# Patient Record
Sex: Female | Born: 1995 | Race: White | Hispanic: Yes | Marital: Single | Smoking: Never smoker
Health system: Southern US, Community
[De-identification: ages and names within clinical notes are randomized; demographics above are authoritative.]

## PROBLEM LIST (undated history)

## (undated) DIAGNOSIS — K5900 Constipation, unspecified: Secondary | ICD-10-CM

## (undated) DIAGNOSIS — F32A Depression, unspecified: Secondary | ICD-10-CM

## (undated) DIAGNOSIS — R51 Headache: Secondary | ICD-10-CM

## (undated) DIAGNOSIS — R519 Headache, unspecified: Secondary | ICD-10-CM

## (undated) DIAGNOSIS — R42 Dizziness and giddiness: Secondary | ICD-10-CM

## (undated) DIAGNOSIS — F419 Anxiety disorder, unspecified: Secondary | ICD-10-CM

## (undated) DIAGNOSIS — R630 Anorexia: Secondary | ICD-10-CM

## (undated) DIAGNOSIS — F319 Bipolar disorder, unspecified: Secondary | ICD-10-CM

## (undated) HISTORY — DX: Dizziness and giddiness: R42

## (undated) HISTORY — PX: TOOTH EXTRACTION: SUR596

## (undated) HISTORY — DX: Headache: R51

## (undated) HISTORY — DX: Constipation, unspecified: K59.00

## (undated) HISTORY — DX: Headache, unspecified: R51.9

---
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
## 2017-01-22 ENCOUNTER — Encounter: Payer: Self-pay | Admitting: Family Medicine

## 2017-01-22 ENCOUNTER — Ambulatory Visit (INDEPENDENT_AMBULATORY_CARE_PROVIDER_SITE_OTHER): Payer: 59 | Admitting: Family Medicine

## 2017-01-22 VITALS — BP 102/70 | HR 57 | Ht 65.0 in | Wt 147.0 lb

## 2017-01-22 DIAGNOSIS — R197 Diarrhea, unspecified: Secondary | ICD-10-CM | POA: Diagnosis not present

## 2017-01-22 DIAGNOSIS — F509 Eating disorder, unspecified: Secondary | ICD-10-CM | POA: Diagnosis not present

## 2017-01-22 DIAGNOSIS — R110 Nausea: Secondary | ICD-10-CM | POA: Diagnosis not present

## 2017-01-22 NOTE — Progress Notes (Signed)
Subjective:   Patient ID: Laura Mora, female    DOB: 1995-10-21, 21 y.o.   MRN: [id number]  Laura Mora is a pleasant 21 y.o. year old female who presents to clinic today with Nausea  on 01/22/2017  HPI:  H/o intermittent nausea and vomiting- cyclical for years- in past, seemed related to her eating disorder (she was in an inpatient treatment facility for this earlier this year).  Now she is also having diarrhea x 4 weeks.  No blood in stool.  No fever.  Some epigastric and RLL pain.  Of note, brother was recently treated for "a parasite."  Their father is from [place] and she has been to [place] although not recently.  Phenergan does help with nausea but makes her sleepy.  Zofran has not been effective.  She feels her eating disorder has been "under okay control" and not what is causing her symptoms but admits that being nauseated all   Current Outpatient Prescriptions on File Prior to Visit  Medication Sig Dispense Refill  . cyclobenzaprine (FLEXERIL) 10 MG tablet Take 10 mg by mouth 3 (three) times daily as needed for muscle spasms.    Marland Kitchen. docusate sodium (COLACE) 100 MG capsule Take 100 mg by mouth 2 (two) times daily.    . feeding supplement, ENSURE ENLIVE, (ENSURE ENLIVE) LIQD Take 237 mLs by mouth daily as needed.    . Flaxseed, Linseed, (FLAX SEEDS PO) Take by mouth.    . Melatonin-Pyridoxine (MELATONEX PO) Take by mouth.    . meloxicam (MOBIC) 15 MG tablet Take 15 mg by mouth daily.    . Multiple Vitamin (MULTIVITAMIN) tablet Take 1 tablet by mouth daily.    . Omeprazole 20 MG TBEC Take by mouth.    . ondansetron (ZOFRAN) 4 MG tablet Take 4 mg by mouth every 8 (eight) hours as needed for nausea or vomiting.    . polyethylene glycol (MIRALAX / GLYCOLAX) packet Take 17 g by mouth daily.    . promethazine (PHENERGAN) 12.5 MG tablet Take 12.5 mg by mouth every 6 (six) hours as needed for nausea or vomiting.    . sucralfate (CARAFATE) 1 g tablet Take 1 tablet (1 g  total) by mouth 4 (four) times daily -  with meals and at bedtime. 40 tablet 1  . traMADol (ULTRAM) 50 MG tablet Take 1 tablet (50 mg total) by mouth every 8 (eight) hours as needed. 60 tablet 0   No current facility-administered medications on file prior to visit.     Allergies  Allergen Reactions  . Ibuprofen Swelling and Other (See Comments)    Coated tablets only    Past Medical History:  Diagnosis Date  . Anorexia   . Constipation   . Dizziness   . Frequent headaches     Past Surgical History:  Procedure Laterality Date  . TOOTH EXTRACTION      Family History  Problem Relation Age of Onset  . Arthritis Father   . Stroke Father   . Arthritis Maternal Grandmother   . Cancer Maternal Grandmother   . Cancer Maternal Grandfather   . Alcohol abuse Paternal Grandfather   . Cancer Paternal Uncle     Social History   Social History  . Marital status: Single    Spouse name: N/A  . Number of children: N/A  . Years of education: N/A   Occupational History  . Not on file.   Social History Main Topics  . Smoking status: Never Smoker  . Smokeless  tobacco: Never Used  . Alcohol use No  . Drug use: No  . Sexual activity: No   Other Topics Concern  . Not on file   Social History Narrative  . No narrative on file   The PMH, PSH, Social History, Family History, Medications, and allergies have been reviewed in [facility], and have been updated if relevant.   Review of Systems  Constitutional: Negative for fever.  Gastrointestinal: Positive for abdominal pain, diarrhea, nausea and vomiting. Negative for anal bleeding, blood in stool, constipation and rectal pain.  All other systems reviewed and are negative.      Objective:    BP 102/70   Pulse (!) 57   Ht 5\' 5"  (1.651 m)   Wt 147 lb (66.7 kg)   LMP 01/08/2017   SpO2 98%   BMI 24.46 kg/m   Wt Readings from Last 3 Encounters:  01/22/17 147 lb (66.7 kg) (78 %, Z= 0.76)*  11/05/16 151 lb 0.1 oz (68.5 kg) (82 %,  Z= 0.91)*  09/20/16 160 lb 8 oz (72.8 kg) (88 %, Z= 1.19)*   * Growth percentiles are based on CDC 2-20 Years data.     Physical Exam  Constitutional: She is oriented to person, place, and time. She appears well-developed and well-nourished. No distress.  HENT:  Head: Normocephalic and atraumatic.  Eyes: Conjunctivae are normal.  Cardiovascular: Normal rate.   Abdominal: Soft. Bowel sounds are normal. She exhibits no distension and no mass. There is no tenderness. There is no rebound and no guarding.  Musculoskeletal: Normal range of motion. She exhibits no edema.  Neurological: She is alert and oriented to person, place, and time. No cranial nerve deficit.  Skin: Skin is warm and dry. She is not diaphoretic.  Psychiatric: She has a normal mood and affect. Her behavior is normal. Judgment and thought content normal.  Nursing note and vitals reviewed.         Assessment & Plan:   Nausea  Eating disorder  Diarrhea, unspecified type - Plan: Stool culture, Helicobacter pylori special antigen No Follow-up on file.

## 2017-01-22 NOTE — Assessment & Plan Note (Signed)
With vomiting and now with diarrhea. >25 minutes spent in face to face time with patient, >50% spent in counselling or coordination of care This is complicated by her eating disorder but states that she is remaining in contact with her therapist and so far she is managing this. She would like to be tested for a parasite.  Will do stool cx and H pylori here but explained to Laura Mora that we will likely need the help of GI for further work up if these tests are negative. The patient indicates understanding of these issues and agrees with the plan.

## 2017-01-22 NOTE — Addendum Note (Signed)
Addended by: Alvina ChouWALSH, TERRI J on: 01/22/2017 02:29 PM   Modules accepted: Orders

## 2017-01-25 NOTE — Addendum Note (Signed)
Addended by: Liane ComberHAVERS, Lucelia Lacey C on: 01/25/2017 10:04 AM   Modules accepted: Orders

## 2017-01-28 LAB — HELICOBACTER PYLORI  SPECIAL ANTIGEN: H. PYLORI ANTIGEN STOOL: NOT DETECTED

## 2017-01-29 ENCOUNTER — Encounter: Payer: Self-pay | Admitting: Family Medicine

## 2017-01-29 LAB — STOOL CULTURE

## 2017-02-26 ENCOUNTER — Ambulatory Visit: Payer: 59 | Admitting: Internal Medicine

## 2017-02-26 ENCOUNTER — Ambulatory Visit: Payer: 59 | Admitting: Family Medicine

## 2017-03-13 ENCOUNTER — Encounter: Payer: 59 | Admitting: Family Medicine

## 2017-03-26 ENCOUNTER — Encounter: Payer: Self-pay | Admitting: Family Medicine

## 2017-05-13 ENCOUNTER — Ambulatory Visit (INDEPENDENT_AMBULATORY_CARE_PROVIDER_SITE_OTHER): Payer: 59 | Admitting: Family Medicine

## 2017-05-13 ENCOUNTER — Encounter: Payer: Self-pay | Admitting: Family Medicine

## 2017-05-13 VITALS — BP 114/76 | HR 74 | Temp 98.6°F | Ht 65.0 in | Wt 148.4 lb

## 2017-05-13 DIAGNOSIS — Z0000 Encounter for general adult medical examination without abnormal findings: Secondary | ICD-10-CM

## 2017-05-13 DIAGNOSIS — Z01419 Encounter for gynecological examination (general) (routine) without abnormal findings: Secondary | ICD-10-CM

## 2017-05-13 DIAGNOSIS — F509 Eating disorder, unspecified: Secondary | ICD-10-CM | POA: Diagnosis not present

## 2017-05-13 NOTE — Patient Instructions (Signed)
Great to see you, Laura Mora. I will contact you with what I find out and with your lab results.  I hope you have a happy holiday and a happy birthday!

## 2017-05-13 NOTE — Assessment & Plan Note (Signed)
Reviewed preventive care protocols, scheduled due services, and updated immunizations Discussed nutrition, exercise, diet, and healthy lifestyle.  

## 2017-05-13 NOTE — Assessment & Plan Note (Signed)
Deteriorated with worsening symptoms of depression. No SI or HI- has great support system at home. Will look into therapists who specialize in eating disorders. The patient indicates understanding of these issues and agrees with the plan.

## 2017-05-13 NOTE — Progress Notes (Signed)
Subjective:   Patient ID: Laura Mora, female    DOB: 04/15/1996, 21 y.o.   MRN: [id number]  Laura Mora is a pleasant 21 y.o. year old female who presents to clinic today with Annual Exam (Patient is here today for a CPE. She has never been sexually active so is unsure if PAP is needed.  She is not currently fasting.)  on 05/13/2017  HPI:  Admits to being more depressed lately but the last 3 days are much better. Has been worried about her weight again- does not want to go back into recovery for her eating disorder. Sisters and parents have been very supportive. She has contacted her previous therapist but she has left the practice. No current SI or HI.  No results found for: CHOL, HDL, LDLCALC, LDLDIRECT, TRIG, CHOLHDL  Lab Results  Component Value Date   NA 139 08/29/2016   K 4.3 08/29/2016   CL 105 08/29/2016   CO2 27 08/29/2016   Lab Results  Component Value Date   WBC 8.1 08/29/2016   HGB 12.0 08/29/2016   HCT 36.5 08/29/2016   MCV 84.5 08/29/2016   PLT 340.0 08/29/2016     Current Outpatient Medications on File Prior to Visit  Medication Sig Dispense Refill  . docusate sodium (COLACE) 100 MG capsule Take 100 mg by mouth 2 (two) times daily.    . Flaxseed, Linseed, (FLAX SEEDS PO) Take by mouth.    . Melatonin-Pyridoxine (MELATONEX PO) Take by mouth.    . Multiple Vitamin (MULTIVITAMIN) tablet Take 1 tablet by mouth daily.     No current facility-administered medications on file prior to visit.     Allergies  Allergen Reactions  . Ibuprofen Swelling and Other (See Comments)    Coated tablets only    Past Medical History:  Diagnosis Date  . Anorexia   . Constipation   . Dizziness   . Frequent headaches     Past Surgical History:  Procedure Laterality Date  . TOOTH EXTRACTION      Family History  Problem Relation Age of Onset  . Arthritis Father   . Stroke Father   . Arthritis Maternal Grandmother   . Cancer Maternal Grandmother   . Cancer  Maternal Grandfather   . Alcohol abuse Paternal Grandfather   . Cancer Paternal Uncle     Social History   Socioeconomic History  . Marital status: Single    Spouse name: Not on file  . Number of children: Not on file  . Years of education: Not on file  . Highest education level: Not on file  Social Needs  . Financial resource strain: Not on file  . Food insecurity - worry: Not on file  . Food insecurity - inability: Not on file  . Transportation needs - medical: Not on file  . Transportation needs - non-medical: Not on file  Occupational History  . Not on file  Tobacco Use  . Smoking status: Never Smoker  . Smokeless tobacco: Never Used  Substance and Sexual Activity  . Alcohol use: No  . Drug use: No  . Sexual activity: No  Other Topics Concern  . Not on file  Social History Narrative  . Not on file   The PMH, PSH, Social History, Family History, Medications, and allergies [facility], and have been updated if relevant.   Review of Systems  Constitutional: Negative.   HENT: Negative.   Eyes: Negative.   Respiratory: Negative.   Cardiovascular: Negative.  Gastrointestinal: Negative.   Endocrine: Negative.   Genitourinary: Negative.   Musculoskeletal: Negative.   Skin: Negative.   Allergic/Immunologic: Negative.   Neurological: Negative.   Hematological: Negative.   Psychiatric/Behavioral: Positive for dysphoric mood and sleep disturbance. Negative for agitation, behavioral problems, confusion, decreased concentration, hallucinations, self-injury and suicidal ideas. The patient is nervous/anxious. The patient is not hyperactive.   All other systems reviewed and are negative.      Objective:    BP 114/76 (BP Location: Left Arm, Patient Position: Sitting, Cuff Size: Normal)   Pulse 74   Temp 98.6 F (37 C) (Oral)   Ht 5\' 5"  (1.651 m)   Wt 148 lb 6.4 oz (67.3 kg)   LMP 05/06/2017   SpO2 99%   BMI 24.70 kg/m    Physical Exam    Constitutional: She is oriented to person, place, and time. She appears well-developed and well-nourished. No distress.  HENT:  Head: Normocephalic and atraumatic.  Eyes: Conjunctivae are normal.  Cardiovascular: Normal rate and regular rhythm.  Pulmonary/Chest: Effort normal and breath sounds normal.  Abdominal: Soft. Bowel sounds are normal. She exhibits no distension and no mass. There is no tenderness. There is no rebound and no guarding.  Musculoskeletal: Normal range of motion. She exhibits no edema.  Neurological: She is alert and oriented to person, place, and time. No cranial nerve deficit.  Skin: Skin is warm and dry. She is not diaphoretic.  Psychiatric: She has a normal mood and affect. Her behavior is normal. Judgment and thought content normal.  Nursing note and vitals reviewed.         Assessment & Plan:   Well woman exam - Plan: CBC with Differential/Platelet, Comprehensive metabolic panel, Lipid panel, TSH No Follow-up on file.

## 2017-05-14 LAB — CBC WITH DIFFERENTIAL/PLATELET
BASOS ABS: 0 10*3/uL (ref 0.0–0.1)
Basophils Relative: 0.5 % (ref 0.0–3.0)
Eosinophils Absolute: 0.2 10*3/uL (ref 0.0–0.7)
Eosinophils Relative: 2.4 % (ref 0.0–5.0)
HCT: 35.7 % — ABNORMAL LOW (ref 36.0–49.0)
Hemoglobin: 11.7 g/dL — ABNORMAL LOW (ref 12.0–16.0)
LYMPHS ABS: 1.2 10*3/uL (ref 0.7–4.0)
Lymphocytes Relative: 18.7 % — ABNORMAL LOW (ref 24.0–48.0)
MCHC: 32.8 g/dL (ref 31.0–37.0)
MCV: 90.9 fl (ref 78.0–98.0)
MONO ABS: 0.6 10*3/uL (ref 0.1–1.0)
Monocytes Relative: 8.8 % (ref 3.0–12.0)
NEUTROS ABS: 4.6 10*3/uL (ref 1.4–7.7)
NEUTROS PCT: 69.6 % (ref 43.0–71.0)
PLATELETS: 346 10*3/uL (ref 150.0–575.0)
RBC: 3.92 Mil/uL (ref 3.80–5.70)
RDW: 14.6 % (ref 11.4–15.5)
WBC: 6.7 10*3/uL (ref 4.5–13.5)

## 2017-05-14 LAB — COMPREHENSIVE METABOLIC PANEL
ALK PHOS: 63 U/L (ref 47–119)
ALT: 10 U/L (ref 0–35)
AST: 16 U/L (ref 0–37)
Albumin: 4.6 g/dL (ref 3.5–5.2)
BUN: 13 mg/dL (ref 6–23)
CO2: 31 meq/L (ref 19–32)
Calcium: 9.6 mg/dL (ref 8.4–10.5)
Chloride: 102 mEq/L (ref 96–112)
Creatinine, Ser: 0.75 mg/dL (ref 0.40–1.20)
GFR: 104.84 mL/min (ref >60.00)
Glucose, Bld: 90 mg/dL (ref 70–99)
POTASSIUM: 4.3 meq/L (ref 3.5–5.1)
SODIUM: 139 meq/L (ref 135–145)
TOTAL PROTEIN: 7.2 g/dL (ref 6.0–8.3)
Total Bilirubin: 0.5 mg/dL (ref 0.2–1.2)

## 2017-05-14 LAB — LIPID PANEL
CHOLESTEROL: 178 mg/dL (ref 0–200)
HDL: 61.1 mg/dL (ref >39.00)
LDL CALC: 103 mg/dL — AB (ref 0–99)
NonHDL: 117.35
TRIGLYCERIDES: 70 mg/dL (ref 0.0–149.0)
Total CHOL/HDL Ratio: 3
VLDL: 14 mg/dL (ref 0.0–40.0)

## 2017-05-14 LAB — TSH: TSH: 1.3 u[IU]/mL (ref 0.40–5.00)

## 2017-12-25 IMAGING — DX DG SACRUM/COCCYX 2+V
3 series · 3 of 3 positions shown · non-contrast
Comparison: 11/09/2015

CLINICAL DATA: Sacral fracture.  Evaluate for healing.

EXAM:
SACRUM AND COCCYX - 2+ VIEW

[coccyx ap]
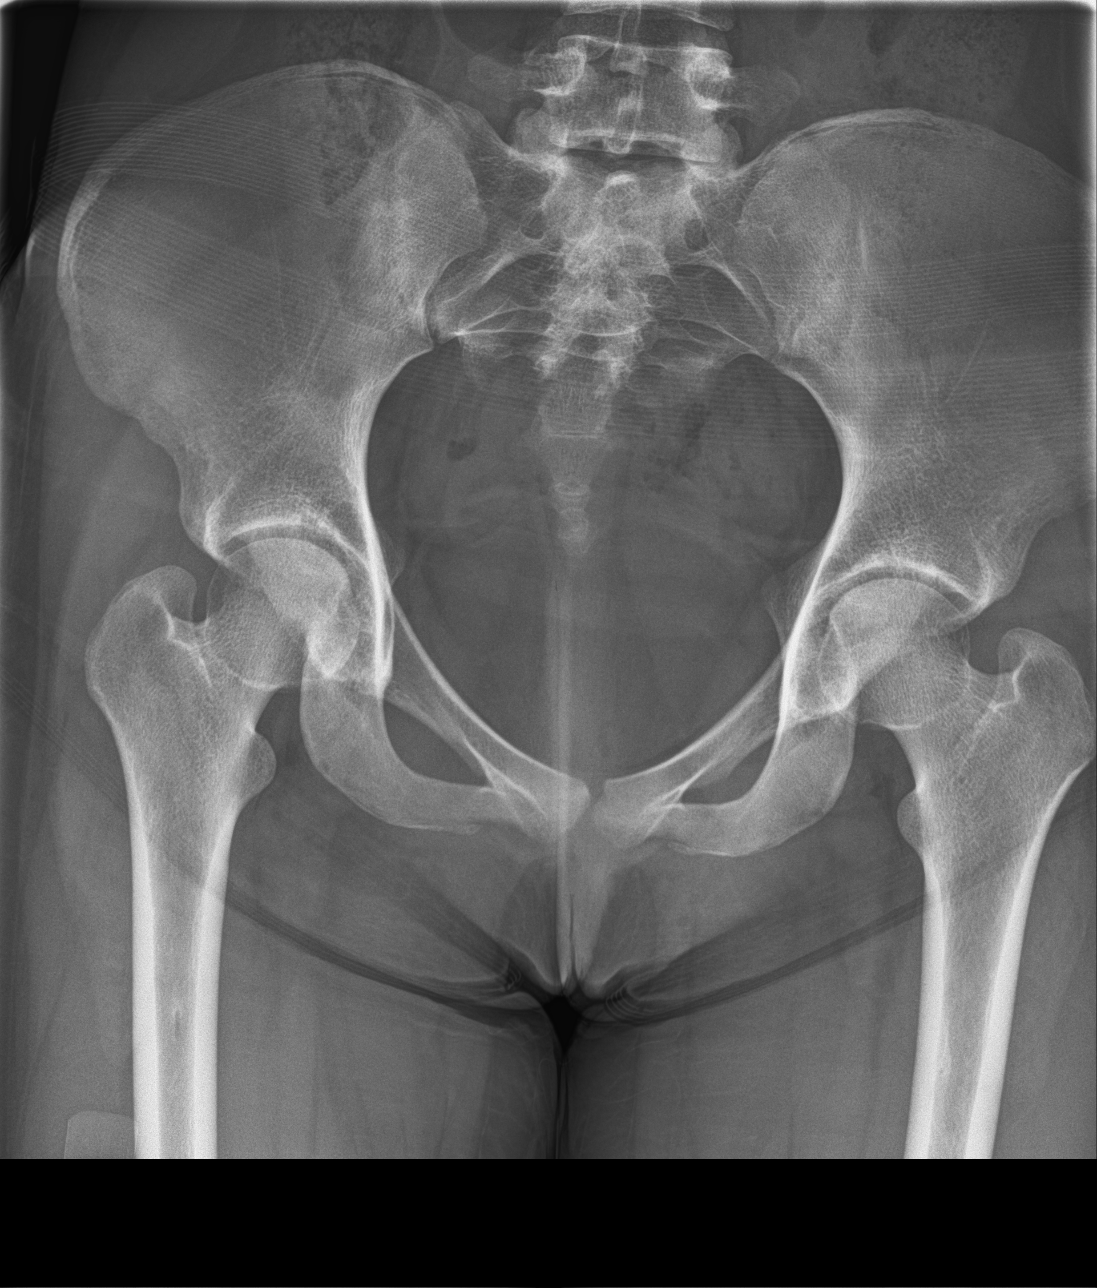

[sacrum ap]
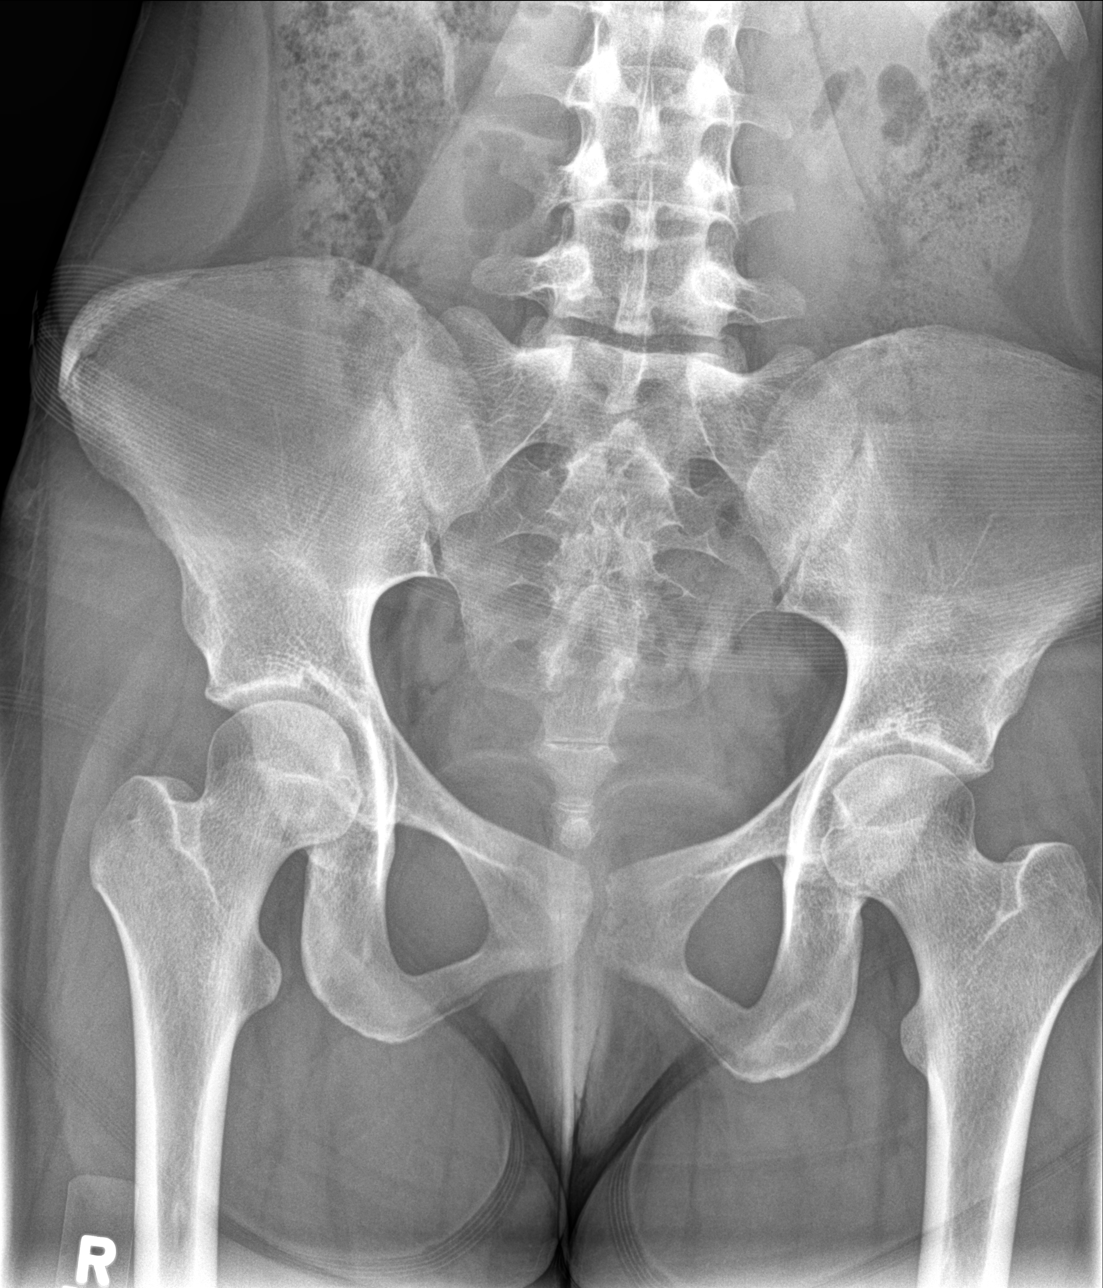

[sacrum lat]
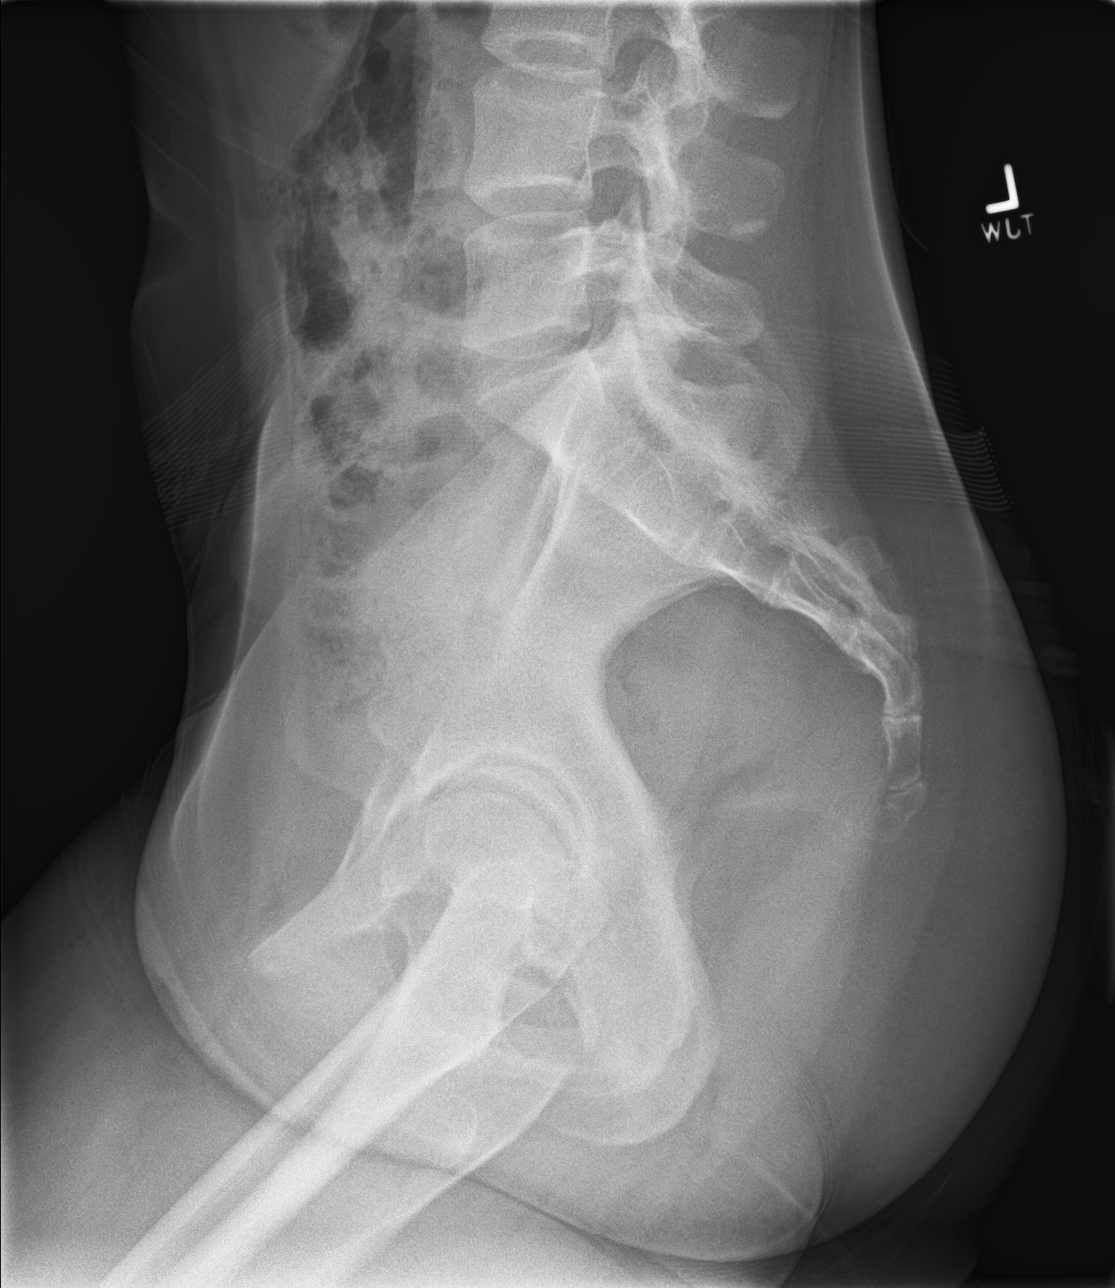

[3 of 3 positions shown; findings below may reference images not displayed]

FINDINGS: No residual evidence of a fracture.  Fracture has healed.

No bone lesions.

SI joints are normally spaced and aligned.

Soft tissues are unremarkable.
IMPRESSION: Healed sacral fracture.  No new abnormalities.

## 2019-01-19 ENCOUNTER — Encounter: Payer: Self-pay | Admitting: Internal Medicine

## 2019-01-19 ENCOUNTER — Other Ambulatory Visit: Payer: Self-pay

## 2019-01-19 ENCOUNTER — Ambulatory Visit (INDEPENDENT_AMBULATORY_CARE_PROVIDER_SITE_OTHER): Payer: 59 | Admitting: Internal Medicine

## 2019-01-19 VITALS — BP 114/72 | HR 80 | Temp 98.0°F | Ht 65.0 in | Wt 173.0 lb

## 2019-01-19 DIAGNOSIS — N62 Hypertrophy of breast: Secondary | ICD-10-CM | POA: Diagnosis not present

## 2019-01-19 DIAGNOSIS — F50019 Anorexia nervosa, restricting type, unspecified: Secondary | ICD-10-CM

## 2019-01-19 DIAGNOSIS — R519 Headache, unspecified: Secondary | ICD-10-CM

## 2019-01-19 DIAGNOSIS — F329 Major depressive disorder, single episode, unspecified: Secondary | ICD-10-CM

## 2019-01-19 DIAGNOSIS — K5909 Other constipation: Secondary | ICD-10-CM

## 2019-01-19 DIAGNOSIS — L989 Disorder of the skin and subcutaneous tissue, unspecified: Secondary | ICD-10-CM

## 2019-01-19 DIAGNOSIS — R51 Headache: Secondary | ICD-10-CM

## 2019-01-19 DIAGNOSIS — F419 Anxiety disorder, unspecified: Secondary | ICD-10-CM

## 2019-01-19 DIAGNOSIS — K219 Gastro-esophageal reflux disease without esophagitis: Secondary | ICD-10-CM

## 2019-01-19 DIAGNOSIS — F5001 Anorexia nervosa, restricting type: Secondary | ICD-10-CM

## 2019-01-19 DIAGNOSIS — M549 Dorsalgia, unspecified: Secondary | ICD-10-CM

## 2019-01-19 DIAGNOSIS — F32A Depression, unspecified: Secondary | ICD-10-CM

## 2019-01-19 NOTE — Progress Notes (Signed)
Subjective:    Patient ID: Laura Mora, female    DOB: 1995-11-12, 23 y.o.   MRN: [id number]  HPI  Patient presents to the clinic today to establish care from Dr. Dayton MartesAron.  She is due for a follow up of chronic conditions.  She would also like to have a few places on her skin checked.  Depression/Anxiety:  She is currently not taking any medication for this.  She does not see a therapist.  She denies SI/HI.  Eating disorder: Hx of Anorexia. Not currently seeing a therapist.  GERD:  Triggered by gluten, dairy.  She does not take any medication for this.  There is no upper GI on file. She is [place] up [place] holistic doctor [place]  Chronic Constipation: Triggered by gluten, dairy.  She does not take any medication for this.  There is no upper colonoscopy on file. She is following up with a holistic doctor in [place]  Frequent Headaches: Now turning into migraines. These occur 1-3 x day. They are located in the temples or in the forehead. She describes the pain as tight and squeezing. She has dizziness, sensitive to light, nausea and vomiting. She has taken Acetaminophen with minimal relief.  Breast Reduction: She is having persistent back pain, yeast under her breast. She would like a referral to plastic surgery for a reduction.  Flu: never Tetanus: 2009 Pap Smear: never Dentist: annually  Review of Systems      Past Medical History:  Diagnosis Date  . Anorexia   . Constipation   . Dizziness   . Frequent headaches     Current Outpatient Medications  Medication Sig Dispense Refill  . docusate sodium (COLACE) 100 MG capsule Take 100 mg by mouth 2 (two) times daily.    . Flaxseed, Linseed, (FLAX SEEDS PO) Take by mouth.    . Melatonin-Pyridoxine (MELATONEX PO) Take by mouth.    . Multiple Vitamin (MULTIVITAMIN) tablet Take 1 tablet by mouth daily.     No current facility-administered medications for this visit.     Allergies  Allergen Reactions  .  Ibuprofen Swelling and Other (See Comments)    Coated tablets only    Family History  Problem Relation Age of Onset  . Arthritis Father   . Stroke Father   . Arthritis Maternal Grandmother   . Cancer Maternal Grandmother   . Cancer Maternal Grandfather   . Alcohol abuse Paternal Grandfather   . Cancer Paternal Uncle     Social History   Socioeconomic History  . Marital status: Single    Spouse name: Not on file  . Number of children: Not on file  . Years of education: Not on file  . Highest education level: Not on file  Occupational History  . Not on file  Social Needs  . Financial resource strain: Not on file  . Food insecurity    Worry: Not on file    Inability: Not on file  . Transportation needs    Medical: Not on file    Non-medical: Not on file  Tobacco Use  . Smoking status: Never Smoker  . Smokeless tobacco: Never Used  Substance and Sexual Activity  . Alcohol use: No  . Drug use: No  . Sexual activity: Never  Lifestyle  . Physical activity    Days per week: Not on file    Minutes per session: Not on file  . Stress: Not on file  Relationships  . Social connections  Talks on phone: Not on file    Gets together: Not on file    Attends religious service: Not on file    Active member of club or organization: Not on file    Attends meetings of clubs or organizations: Not on file    Relationship status: Not on file  . Intimate partner violence    Fear of current or ex partner: Not on file    Emotionally abused: Not on file    Physically abused: Not on file    Forced sexual activity: Not on file  Other Topics Concern  . Not on file  Social History Narrative  . Not on file     Constitutional: Pt reports frequent headaches. Denies fever, malaise, fatigue, or abrupt weight changes.  HEENT: Denies eye pain, eye redness, ear pain, ringing in the ears, wax buildup, runny nose, nasal congestion, bloody nose, or sore throat. Respiratory: Denies difficulty  breathing, shortness of breath, cough or sputum production.   Cardiovascular: Denies chest pain, chest tightness, palpitations or swelling in the hands or feet.  Gastrointestinal: Pt reports reflux, constipation. Denies abdominal pain, bloating, diarrhea or blood in the stool.  GU: Denies urgency, frequency, pain with urination, burning sensation, blood in urine, odor or discharge. Musculoskeletal: Pt reports upper back pain. Denies decrease in range of motion, difficulty with gait, or joint pain and swelling.  Skin: Pt reports reoccurring yeast under breast, skin lesions on arms. Denies redness, rashes, or ulcercations.  Neurological: Denies dizziness, difficulty with memory, difficulty with speech or problems with balance and coordination.  Psych: Pt reports anxiety and depression. Denies SI/HI.  No other specific complaints in a complete review of systems (except as listed in HPI above).  Objective:   Physical Exam  BP 114/72   Pulse 80   Temp 98 F (36.7 C) (Temporal)   Ht 5\' 5"  (1.651 m)   Wt 173 lb (78.5 kg)   LMP 01/17/2019   SpO2 99%   BMI 28.79 kg/m   Wt Readings from Last 3 Encounters:  05/13/17 148 lb 6.4 oz (67.3 kg) (78 %, Z= 0.79)*  01/22/17 147 lb (66.7 kg) (78 %, Z= 0.76)*  11/05/16 151 lb 0.1 oz (68.5 kg) (82 %, Z= 0.91)*   * Growth percentiles are based on CDC (Girls, 2-20 Years) data.    General: Appears her stated age, well developed, well nourished in NAD. Skin: Warm, dry and intact. Hyperpigmented lesions of bilateral forearms. HEENT: Head: normal shape and size; Eyes: sclera white, no icterus, conjunctiva pink, PERRLA and EOMs intact; Ears: Tm's gray and intact, normal light reflex; Neck:  Neck supple, trachea midline. No masses, lumps or thyromegaly present.  Cardiovascular: Normal rate and rhythm.  Pulmonary/Chest: Normal effort and positive vesicular breath sounds. No respiratory distress. No wheezes, rales or ronchi noted.  Abdomen: Soft and  nontender. Normal bowel sounds. No distention or masses noted.  Musculoskeletal: No difficulty with gait.  Neurological: Alert and oriented.  Psychiatric: Mood and affect normal. Behavior is normal. Judgment and thought content normal.     BMET    Component Value Date/Time   NA 139 05/13/2017 1549   K 4.3 05/13/2017 1549   CL 102 05/13/2017 1549   CO2 31 05/13/2017 1549   GLUCOSE 90 05/13/2017 1549   BUN 13 05/13/2017 1549   CREATININE 0.75 05/13/2017 1549   CALCIUM 9.6 05/13/2017 1549   GFRNONAA >60 03/24/2016 0620   GFRAA >60 03/24/2016 0620    Lipid Panel  Component Value Date/Time   CHOL 178 05/13/2017 1549   TRIG 70.0 05/13/2017 1549   HDL 61.10 05/13/2017 1549   CHOLHDL 3 05/13/2017 1549   VLDL 14.0 05/13/2017 1549   LDLCALC 103 (H) 05/13/2017 1549    CBC    Component Value Date/Time   WBC 6.7 05/13/2017 1549   RBC 3.92 05/13/2017 1549   HGB 11.7 (L) 05/13/2017 1549   HCT 35.7 (L) 05/13/2017 1549   PLT 346.0 05/13/2017 1549   MCV 90.9 05/13/2017 1549   MCH 29.0 03/23/2016 1254   MCHC 32.8 05/13/2017 1549   RDW 14.6 05/13/2017 1549   LYMPHSABS 1.2 05/13/2017 1549   MONOABS 0.6 05/13/2017 1549   EOSABS 0.2 05/13/2017 1549   BASOSABS 0.0 05/13/2017 1549    Hgb A1C Lab Results  Component Value Date   HGBA1C 5.8 01/12/2014            Assessment & Plan:   Skin Lesion of Arm:  Benign Monitor  Upper Back Pain, Large Breast:  Request reduction Referral to plastics placed  Make an appt for your annual exam

## 2019-01-20 DIAGNOSIS — K5909 Other constipation: Secondary | ICD-10-CM | POA: Insufficient documentation

## 2019-01-20 DIAGNOSIS — R519 Headache, unspecified: Secondary | ICD-10-CM | POA: Insufficient documentation

## 2019-01-20 MED ORDER — AMITRIPTYLINE HCL 25 MG PO TABS: 25.0000 mg | ORAL_TABLET | Freq: Every day | ORAL | 2 refills | Status: DC

## 2019-01-20 NOTE — Assessment & Plan Note (Signed)
In remission Will monitor 

## 2019-01-20 NOTE — Assessment & Plan Note (Signed)
Discussed stress relieving techniques Will trial Amitriptyline 25 mg PO QHS- sedation caution given Will monitor

## 2019-01-20 NOTE — Assessment & Plan Note (Signed)
Continue treatment per your holistic doctor Will monitor 

## 2019-01-20 NOTE — Assessment & Plan Note (Signed)
Support offered today She does not think she needs treatment at this time

## 2019-01-20 NOTE — Patient Instructions (Signed)
Form - Headache Record There are many types and causes of headaches. A headache record can help guide your treatment plan. Use this form to record the details. Bring this form with you to your follow-up visits. Follow your health care provider's instructions on how to describe your headache. You may be asked to:  Use a pain scale. This is a tool to rate the intensity of your headache using words or numbers.  Describe what your headache feels like, such as dull, achy, throbbing, or sharp. Headache record Date: _______________ Time (from start to end): ____________________ Location of the headache: _________________________  Intensity of the headache: ____________________ Description of the headache: ______________________________________________________________  Hours of sleep the night before the headache: __________  Food or drinks before the headache started: ______________________________________________________________________________________  Events before the headache started: _______________________________________________________________________________________________  Symptoms before the headache started: __________________________________________________________________________________________  Symptoms during the headache: __________________________________________________________________________________________________  Treatment: ________________________________________________________________________________________________________________  Effect of treatment: _________________________________________________________________________________________________________  Other comments: ___________________________________________________________________________________________________________ Date: _______________ Time (from start to end): ____________________ Location of the headache: _________________________  Intensity of the headache: ____________________ Description of the  headache: ______________________________________________________________  Hours of sleep the night before the headache: __________  Food or drinks before the headache started: ______________________________________________________________________________________  Events before the headache started: ____________________________________________________________________________________________  Symptoms before the headache started: _________________________________________________________________________________________  Symptoms during the headache: _______________________________________________________________________________________________  Treatment: ________________________________________________________________________________________________________________  Effect of treatment: _________________________________________________________________________________________________________  Other comments: ___________________________________________________________________________________________________________ Date: _______________ Time (from start to end): ____________________ Location of the headache: _________________________  Intensity of the headache: ____________________ Description of the headache: ______________________________________________________________  Hours of sleep the night before the headache: __________  Food or drinks before the headache started: ______________________________________________________________________________________  Events before the headache started: ____________________________________________________________________________________________  Symptoms before the headache started: _________________________________________________________________________________________  Symptoms during the headache: _______________________________________________________________________________________________  Treatment:  ________________________________________________________________________________________________________________  Effect of treatment: _________________________________________________________________________________________________________  Other comments: ___________________________________________________________________________________________________________ Date: _______________ Time (from start to end): ____________________ Location of the headache: _________________________  Intensity of the headache: ____________________ Description of the headache: ______________________________________________________________  Hours of sleep the night before the headache: _________  Food or drinks before the headache started: ______________________________________________________________________________________  Events before the headache started: ____________________________________________________________________________________________  Symptoms before the headache started: _________________________________________________________________________________________  Symptoms during the headache: _______________________________________________________________________________________________  Treatment: ________________________________________________________________________________________________________________  Effect of treatment: _________________________________________________________________________________________________________  Other comments: ___________________________________________________________________________________________________________ Date: _______________ Time (from start to end): ____________________ Location of the headache: _________________________  Intensity of the headache: ____________________ Description of the headache: ______________________________________________________________  Hours of sleep the night before the headache: _________  Food or drinks  before the headache started: ______________________________________________________________________________________  Events before the headache started: ____________________________________________________________________________________________  Symptoms before the headache started: _________________________________________________________________________________________  Symptoms during the headache: _______________________________________________________________________________________________  Treatment: ________________________________________________________________________________________________________________  Effect of treatment: _________________________________________________________________________________________________________  Other comments: ___________________________________________________________________________________________________________ This information is not intended to replace advice given to you by your health care provider. Make sure you discuss any questions you have with your health care provider. Document Released: 06/23/2018 Document Revised: 06/23/2018 Document Reviewed: 06/23/2018 Elsevier Patient Education  2020 [employer]  

## 2019-01-20 NOTE — Assessment & Plan Note (Signed)
Continue treatment per your holistic doctor Will monitor

## 2019-02-12 ENCOUNTER — Encounter: Payer: 59 | Admitting: Internal Medicine

## 2019-04-07 NOTE — Telephone Encounter (Signed)
error 

## 2019-04-13 ENCOUNTER — Telehealth: Payer: Self-pay

## 2019-04-13 NOTE — Telephone Encounter (Signed)
[place] Night - Client Nonclinical Telephone Record AccessNurse Client [facility] Night - Client Client Site [place] Physician Webb Silversmith - NP Contact Type Call Who Is Calling Physician / Provider / Hospital Call Type Provider Call Message Only Reason for Call Request to send message to Office Initial Comment Caller is calling from [facility] and states that the patients procedure for a breast reduction needs a clinical review. Additional Comment Pamala Hurry from [facility] is calling in regards to Laura Mora [birth year] and her breast reduction procedure. She needs clinical clearance for this procedure. Tel # [phone]. Call Closed By: Benard Halsted Transaction Date/Time: 04/10/2019 5:42:48 PM (ET)

## 2019-04-13 NOTE — Telephone Encounter (Signed)
Clinical clearance? Is the preoperative clearance?

## 2019-04-13 NOTE — Telephone Encounter (Signed)
Please seen 01/19/19 note about breast reduction.

## 2019-04-15 NOTE — Telephone Encounter (Signed)
Spoke to [place] with [facility] and she states she did not see any notes for pt and not sure what is needed... Explained to her that we cannot provide information if they cannot let us know what they need and she proceeded to tell me to just send notes and would provided a fax number, I advised her that is not the way it works and they can request through medical records if that is all that is needed... unclear on what is needed

## 2019-05-18 ENCOUNTER — Ambulatory Visit (INDEPENDENT_AMBULATORY_CARE_PROVIDER_SITE_OTHER)
Admission: RE | Admit: 2019-05-18 | Discharge: 2019-05-18 | Disposition: A | Discharge status: Home / Self Care | Payer: 59 | Source: Ambulatory Visit | Attending: Internal Medicine | Admitting: Internal Medicine

## 2019-05-18 ENCOUNTER — Other Ambulatory Visit: Payer: Self-pay

## 2019-05-18 ENCOUNTER — Ambulatory Visit (INDEPENDENT_AMBULATORY_CARE_PROVIDER_SITE_OTHER): Payer: 59 | Admitting: Internal Medicine

## 2019-05-18 ENCOUNTER — Encounter: Payer: Self-pay | Admitting: Internal Medicine

## 2019-05-18 VITALS — BP 114/68 | HR 91 | Temp 98.6°F | Wt 164.0 lb

## 2019-05-18 DIAGNOSIS — N926 Irregular menstruation, unspecified: Secondary | ICD-10-CM | POA: Diagnosis not present

## 2019-05-18 DIAGNOSIS — S322XXA Fracture of coccyx, initial encounter for closed fracture: Secondary | ICD-10-CM

## 2019-05-18 DIAGNOSIS — G8929 Other chronic pain: Secondary | ICD-10-CM

## 2019-05-18 DIAGNOSIS — N62 Hypertrophy of breast: Secondary | ICD-10-CM

## 2019-05-18 DIAGNOSIS — S3210XA Unspecified fracture of sacrum, initial encounter for closed fracture: Secondary | ICD-10-CM

## 2019-05-18 DIAGNOSIS — M546 Pain in thoracic spine: Secondary | ICD-10-CM

## 2019-05-18 LAB — POCT URINE PREGNANCY: Preg Test, Ur: NEGATIVE

## 2019-05-18 NOTE — Progress Notes (Addendum)
Subjective:    Patient ID: Laura Mora, female    DOB: April 15, 1996, 23 y.o.   MRN: [id number]  HPI  Pt presents to the clinic today requesting a xray. She has a history of back pain secondary to large breasts and history of sacrum/coccyx fracture.   Upper back pain: She was referred to plastics 01/2019 for her upper back pain.  She mentions she has achy pain to her upper back and she believes it is due to her large breasts. She follows a [facility] in [place] for her back and her next appointment will be next week.   Lower back pain: She reports she fractured her sacrum 4 years ago and coccyx 7 years go. She mentions they healed incorrectly and would need surgery for correction. She describies sharp, burning and dull pain that starts in her posterior pelvic area that radiates up her entire back.  She follows with her chiropractic in [place] and they are requesting she get x-rays of her lower back. Her next appointment will be next week. She denies tingling, numbness, loss of bowel or bladder control. LMP was 03/21/2019, she is not sexually active. She mentions she does not need medication at this time to manage her pain.   Review of Systems      Past Medical History:  Diagnosis Date  . Anorexia   . Constipation   . Dizziness   . Frequent headaches     Current Outpatient Medications  Medication Sig Dispense Refill  . amitriptyline (ELAVIL) 25 MG tablet Take 1 tablet (25 mg total) by mouth at bedtime. 30 tablet 2  . Cholecalciferol (VITAMIN D3) 50 MCG (2000 UT) TABS Take by mouth.    . Cyanocobalamin (VITAMIN B-12) 500 MCG SUBL Place under the tongue.    Marland Kitchen OVER THE COUNTER MEDICATION     . Turmeric (QC TUMERIC COMPLEX PO) Take by mouth.     No current facility-administered medications for this visit.     Allergies  Allergen Reactions  . Ibuprofen Swelling and Other (See Comments)    Coated tablets only    Family History  Problem Relation Age of Onset  . Arthritis  Father   . Stroke Father   . Arthritis Maternal Grandmother   . Cancer Maternal Grandmother   . Lung cancer Maternal Grandfather   . Alcohol abuse Paternal Grandfather   . Lung cancer Maternal Uncle     Social History   Socioeconomic History  . Marital status: Single    Spouse name: Not on file  . Number of children: Not on file  . Years of education: Not on file  . Highest education level: Not on file  Occupational History  . Not on file  Social Needs  . Financial resource strain: Not on file  . Food insecurity    Worry: Not on file    Inability: Not on file  . Transportation needs    Medical: Not on file    Non-medical: Not on file  Tobacco Use  . Smoking status: Never Smoker  . Smokeless tobacco: Never Used  Substance and Sexual Activity  . Alcohol use: Yes    Comment: occasional  . Drug use: No  . Sexual activity: Never  Lifestyle  . Physical activity    Days per week: Not on file    Minutes per session: Not on file  . Stress: Not on file  Relationships  . Social connections    Talks on phone: Not on file  Gets together: Not on file    Attends religious service: Not on file    Active member of club or organization: Not on file    Attends meetings of clubs or organizations: Not on file    Relationship status: Not on file  . Intimate partner violence    Fear of current or ex partner: Not on file    Emotionally abused: Not on file    Physically abused: Not on file    Forced sexual activity: Not on file  Other Topics Concern  . Not on file  Social History Narrative  . Not on file     Constitutional: Denies fever, malaise, fatigue, headache or abrupt weight changes.  Respiratory: Denies difficulty breathing, shortness of breath, cough or sputum production.   Cardiovascular: Denies chest pain, chest tightness, palpitations or swelling in the hands or feet.  Musculoskeletal: Pt reports upper and lower back pain. Denies decrease in range of motion,  difficulty with gait, muscle pain or joint swelling.  Skin: Denies redness, rashes, lesions or ulcercations.  .  No other specific complaints in a complete review of systems (except as listed in HPI above).  Objective:   Physical Exam BP 114/68   Pulse 91   Temp 98.6 F (37 C) (Temporal)   Wt 74.4 kg   LMP 03/22/2019   SpO2 98%   BMI 27.29 kg/m  Wt Readings from Last 3 Encounters:  05/18/19 74.4 kg  01/19/19 78.5 kg  05/13/17 67.3 kg (78 %, Z= 0.79)*   * Growth percentiles are based on CDC (Girls, 2-20 Years) data.    General: Appears her stated age, well developed, well nourished in NAD. Skin: Warm, dry and intact. No rashes, lesions or ulcerations noted. Cardiovascular: Normal rate and rhythm. S1,S2 noted.  No murmur, rubs or gallops noted.  Pulmonary/Chest: Normal effort and positive vesicular breath sounds. No respiratory distress. No wheezes, rales or ronchi noted.  AMusculoskeletal: Normal flexion, extension and rotation of the spine. No bony tenderness noted over the spine. Strength 5/5 BUE/BLE. Neurological: Alert and oriented. Sensation intact to upper and lower extremities.    BMET    Component Value Date/Time   NA 139 05/13/2017 1549   K 4.3 05/13/2017 1549   CL 102 05/13/2017 1549   CO2 31 05/13/2017 1549   GLUCOSE 90 05/13/2017 1549   BUN 13 05/13/2017 1549   CREATININE 0.75 05/13/2017 1549   CALCIUM 9.6 05/13/2017 1549   GFRNONAA >60 03/24/2016 0620   GFRAA >60 03/24/2016 0620    Lipid Panel     Component Value Date/Time   CHOL 178 05/13/2017 1549   TRIG 70.0 05/13/2017 1549   HDL 61.10 05/13/2017 1549   CHOLHDL 3 05/13/2017 1549   VLDL 14.0 05/13/2017 1549   LDLCALC 103 (H) 05/13/2017 1549    CBC    Component Value Date/Time   WBC 6.7 05/13/2017 1549   RBC 3.92 05/13/2017 1549   HGB 11.7 (L) 05/13/2017 1549   HCT 35.7 (L) 05/13/2017 1549   PLT 346.0 05/13/2017 1549   MCV 90.9 05/13/2017 1549   MCH 29.0 03/23/2016 1254   MCHC 32.8  05/13/2017 1549   RDW 14.6 05/13/2017 1549   LYMPHSABS 1.2 05/13/2017 1549   MONOABS 0.6 05/13/2017 1549   EOSABS 0.2 05/13/2017 1549   BASOSABS 0.0 05/13/2017 1549    Hgb A1C Lab Results  Component Value Date   HGBA1C 5.8 01/12/2014           Assessment & Plan:  Thoracic Back Pain:  Xray thoracic spine today Continue with symptomatic treatment: rest, ice, regular stretches and OTC medications as needed for pain Will follow with plastics   History of Prior Sacral and Coccyx Fracture:  Will get lumbar spine and sacral x-rays Continue with symptomatic treatment: rest, ice, regular stretches and OTC medications as needed for pain Will follow with chiropractor  Irregular Menses:  Urine preg: negative  Will follow up after xrays, return precautions discussed Nicki Reaperegina , NP  This visit occurred during the SARS-CoV-2 public health emergency.  Safety protocols were in place, including screening questions prior to the visit, additional usage of staff PPE, and extensive cleaning of exam room while observing appropriate contact time as indicated for disinfecting solutions.

## 2019-05-18 NOTE — Patient Instructions (Signed)

## 2019-05-22 ENCOUNTER — Encounter: Payer: Self-pay | Admitting: Internal Medicine

## 2019-05-30 ENCOUNTER — Encounter: Payer: Self-pay | Admitting: Internal Medicine

## 2019-05-30 DIAGNOSIS — F419 Anxiety disorder, unspecified: Secondary | ICD-10-CM

## 2019-05-30 DIAGNOSIS — F32A Depression, unspecified: Secondary | ICD-10-CM

## 2019-06-16 ENCOUNTER — Encounter: Payer: 59 | Admitting: Internal Medicine

## 2019-06-16 NOTE — Progress Notes (Deleted)
Subjective:    Patient ID: Laura Mora, female    DOB: 1996/04/26, 23 y.o.   MRN: [id number]  HPI  Pt presents to the clinic today for her annual exam.  Flu: never Tetanus: > 10 years ago Pap Smear: never Dentist:  Diet: Exercise:  Review of Systems      Past Medical History:  Diagnosis Date  . Anorexia   . Constipation   . Dizziness   . Frequent headaches     Current Outpatient Medications  Medication Sig Dispense Refill  . Cholecalciferol (VITAMIN D3) 50 MCG (2000 UT) TABS Take by mouth.    . Cyanocobalamin (VITAMIN B-12) 500 MCG SUBL Place under the tongue.    Marland Kitchen OVER THE COUNTER MEDICATION     . Turmeric (QC TUMERIC COMPLEX PO) Take by mouth.     No current facility-administered medications for this visit.    Allergies  Allergen Reactions  . Ibuprofen Swelling and Other (See Comments)    Coated tablets only    Family History  Problem Relation Age of Onset  . Arthritis Father   . Stroke Father   . Arthritis Maternal Grandmother   . Cancer Maternal Grandmother   . Lung cancer Maternal Grandfather   . Alcohol abuse Paternal Grandfather   . Lung cancer Maternal Uncle     Social History   Socioeconomic History  . Marital status: Single    Spouse name: Not on file  . Number of children: Not on file  . Years of education: Not on file  . Highest education level: Not on file  Occupational History  . Not on file  Tobacco Use  . Smoking status: Never Smoker  . Smokeless tobacco: Never Used  Substance and Sexual Activity  . Alcohol use: Yes    Comment: occasional  . Drug use: No  . Sexual activity: Never  Other Topics Concern  . Not on file  Social History Narrative  . Not on file   Social Determinants of Health   Financial Resource Strain:   . Difficulty of Paying Living Expenses: Not on file  Food Insecurity:   . Worried About Programme researcher, broadcasting/film/video in the Last Year: Not on file  . Ran Out of Food in the Last Year: Not on file    Transportation Needs:   . Lack of Transportation (Medical): Not on file  . Lack of Transportation (Non-Medical): Not on file  Physical Activity:   . Days of Exercise per Week: Not on file  . Minutes of Exercise per Session: Not on file  Stress:   . Feeling of Stress : Not on file  Social Connections:   . Frequency of Communication with Friends and Family: Not on file  . Frequency of Social Gatherings with Friends and Family: Not on file  . Attends Religious Services: Not on file  . Active Member of Clubs or Organizations: Not on file  . Attends Banker Meetings: Not on file  . Marital Status: Not on file  Intimate Partner Violence:   . Fear of Current or Ex-Partner: Not on file  . Emotionally Abused: Not on file  . Physically Abused: Not on file  . Sexually Abused: Not on file     Constitutional: Denies fever, malaise, fatigue, headache or abrupt weight changes.  HEENT: Denies eye pain, eye redness, ear pain, ringing in the ears, wax buildup, runny nose, nasal congestion, bloody nose, or sore throat. Respiratory: Denies difficulty breathing, shortness of breath, cough or  sputum production.   Cardiovascular: Denies chest pain, chest tightness, palpitations or swelling in the hands or feet.  Gastrointestinal: Denies abdominal pain, bloating, constipation, diarrhea or blood in the stool.  GU: Denies urgency, frequency, pain with urination, burning sensation, blood in urine, odor or discharge. Musculoskeletal: Denies decrease in range of motion, difficulty with gait, muscle pain or joint pain and swelling.  Skin: Denies redness, rashes, lesions or ulcercations.  Neurological: Denies dizziness, difficulty with memory, difficulty with speech or problems with balance and coordination.  Psych: Denies anxiety, depression, SI/HI.  No other specific complaints in a complete review of systems (except as listed in HPI above).  Objective:   Physical Exam   There were no  vitals taken for this visit. Wt Readings from Last 3 Encounters:  05/18/19 164 lb (74.4 kg)  01/19/19 173 lb (78.5 kg)  05/13/17 148 lb 6.4 oz (67.3 kg) (78 %, Z= 0.79)*   * Growth percentiles are based on CDC (Girls, 2-20 Years) data.    General: Appears their stated age, well developed, well nourished in NAD. Skin: Warm, dry and intact. No rashes, lesions or ulcerations noted. HEENT: Head: normal shape and size; Eyes: sclera white, no icterus, conjunctiva pink, PERRLA and EOMs intact; Ears: Tm's gray and intact, normal light reflex; Nose: mucosa pink and moist, septum midline; Throat/Mouth: Teeth present, mucosa pink and moist, no exudate, lesions or ulcerations noted.  Neck:  Neck supple, trachea midline. No masses, lumps or thyromegaly present.  Cardiovascular: Normal rate and rhythm. S1,S2 noted.  No murmur, rubs or gallops noted. No JVD or BLE edema. No carotid bruits noted. Pulmonary/Chest: Normal effort and positive vesicular breath sounds. No respiratory distress. No wheezes, rales or ronchi noted.  Abdomen: Soft and nontender. Normal bowel sounds. No distention or masses noted. Liver, spleen and kidneys non palpable. Musculoskeletal: Normal range of motion. No signs of joint swelling. No difficulty with gait.  Neurological: Alert and oriented. Cranial nerves II-XII grossly intact. Coordination normal.  Psychiatric: Mood and affect normal. Behavior is normal. Judgment and thought content normal.   EKG:  BMET    Component Value Date/Time   NA 139 05/13/2017 1549   K 4.3 05/13/2017 1549   CL 102 05/13/2017 1549   CO2 31 05/13/2017 1549   GLUCOSE 90 05/13/2017 1549   BUN 13 05/13/2017 1549   CREATININE 0.75 05/13/2017 1549   CALCIUM 9.6 05/13/2017 1549   GFRNONAA >60 03/24/2016 0620   GFRAA >60 03/24/2016 0620    Lipid Panel     Component Value Date/Time   CHOL 178 05/13/2017 1549   TRIG 70.0 05/13/2017 1549   HDL 61.10 05/13/2017 1549   CHOLHDL 3 05/13/2017 1549    VLDL 14.0 05/13/2017 1549   LDLCALC 103 (H) 05/13/2017 1549    CBC    Component Value Date/Time   WBC 6.7 05/13/2017 1549   RBC 3.92 05/13/2017 1549   HGB 11.7 (L) 05/13/2017 1549   HCT 35.7 (L) 05/13/2017 1549   PLT 346.0 05/13/2017 1549   MCV 90.9 05/13/2017 1549   MCH 29.0 03/23/2016 1254   MCHC 32.8 05/13/2017 1549   RDW 14.6 05/13/2017 1549   LYMPHSABS 1.2 05/13/2017 1549   MONOABS 0.6 05/13/2017 1549   EOSABS 0.2 05/13/2017 1549   BASOSABS 0.0 05/13/2017 1549    Hgb A1C Lab Results  Component Value Date   HGBA1C 5.8 01/12/2014           Assessment & Plan:    Preventative Health Maintenance:  Flu Tetanus Pap smear Encouraged her to consume a balanced diet and exercise regimen Advised her to see an eye doctor and dentist annually Will check CBC, CMET, Lipid profile today  RTC in 1 year, sooner if needed Nicki Reaperegina Donia Yokum, NP This visit occurred during the SARS-CoV-2 public health emergency.  Safety protocols were in place, including screening questions prior to the visit, additional usage of staff PPE, and extensive cleaning of exam room while observing appropriate contact time as indicated for disinfecting solutions.

## 2019-07-20 ENCOUNTER — Encounter: Payer: Self-pay | Admitting: Internal Medicine

## 2019-07-21 ENCOUNTER — Encounter: Payer: 59 | Admitting: Internal Medicine

## 2019-09-13 ENCOUNTER — Encounter: Payer: Self-pay | Admitting: Internal Medicine

## 2019-09-13 ENCOUNTER — Encounter: Payer: Self-pay | Admitting: Dermatology

## 2019-09-14 ENCOUNTER — Ambulatory Visit: Payer: Self-pay | Admitting: Dermatology

## 2019-09-14 NOTE — Telephone Encounter (Signed)
[facility] - Client TELEPHONE ADVICE RECORD Access[facility]urse Patient Name: Laura Mora Gender: Female DOB: April 28, 1996 Age: 24 Y 2 M 19 D Return Phone Number: [phone] (Primary) Address: City/State/Zip: [place] [address] Client [facility] - Client Client Site [facility] - Day Physician Webb Silversmith - NP Contact Type Call Who Is Calling Patient / Member / Family / Caregiver Call Type Triage / Clinical Relationship To Patient Self Return Phone Number [phone] (Primary) Chief Complaint FAINTING or [place] Reason for Call Symptomatic / Request for [place] states she has passed out several times over the weekend. Caller states she is not feeling well now and feels like she will pass out again. Translation No Nurse Assessment Nurse: Raphael Gibney, RN, Vanita Ingles Date/Time (Eastern Time): 09/14/2019 8:11:33 AM Confirm and document reason for call. If symptomatic, describe symptoms. ---Caller states she passed out several times on Friday and Saturday. She tasted rubbing alcohol and could not feel her arms or legs on Friday. passed out on Saturday and went unconsciousness for a minute or 2. Yesterday she felt faint. has been vomiting which started on Friday. Has the patient had close contact with a person known or suspected to have the novel coronavirus illness OR traveled / lives in area with major community spread (including international travel) in the last 14 days from the onset of symptoms? * If Asymptomatic, screen for exposure and travel within the last 14 days. ---No Does the patient have any new or worsening symptoms? ---Yes Will a triage be completed? ---Yes Related visit to physician within the last 2 weeks? ---No Does the PT have any chronic conditions? (i.e. diabetes, asthma, this includes High risk factors for pregnancy, etc.) ---No Is the patient pregnant  or possibly pregnant? (Ask all females between the ages of 61-55) ---No Is this a behavioral health or substance abuse call? ---No Guidelines Guideline Title Affirmed Question Affirmed Notes Nurse Date/Time Eilene Ghazi Time) Fainting Shock suspected (e.g., cold/pale/clammy skin, Stringer, RN, Vera 09/14/2019 8:14:51 AM PLEASE NOTE: All timestamps contained within this report are represented as [place] Standard Time. CONFIDENTIALTY NOTICE: This fax transmission is intended only for the addressee. It contains information that is legally privileged, confidential or otherwise protected from use or disclosure. If you are not the intended recipient, you are strictly prohibited from reviewing, disclosing, copying using or disseminating any of this information or taking any action in reliance on or regarding this information. If you have received this fax in error, please notify us immediately by telephone so that we can arrange for its return to [place]. Phone: [phone], Toll-Free: [phone], Fax: [phone] Page: 2 of 2 Call Id: [id number] Guidelines Guideline Title Affirmed Question Affirmed Notes Nurse Date/Time Eilene Ghazi Time) too weak to stand, low BP, rapid pulse) Disp. Time Eilene Ghazi Time) Disposition Final User 09/14/2019 8:09:13 AM Send to Urgent Frederica Kuster 09/14/2019 8:18:51 AM Send To RN Personal Raphael Gibney, RN, Vanita Ingles 09/14/2019 8:24:28 AM 911 Outcome Documentation Stringer, RN, Vanita Ingles Reason: attempted 911 outcome documentation call and recording states that the mailbox is full and can not accept any more messages at this time. 09/14/2019 8:18:27 AM Call EMS 911 Now Yes Raphael Gibney, RN, Doreatha Lew Disagree/Comply Comply Caller Understands Yes PreDisposition Go to ED Care Advice Given Per Guideline CALL EMS 911 NOW: * Immediate medical attention is needed. You need to hang up and call 911 (or an ambulance). FIRST AID - LIE DOWN FOR SHOCK: * Lie down with  feet elevated. CARE  ADVICE given per Fainting (Adult) guideline. Comments User: Art Buff, RN Date/Time Lamount Cohen Time): 09/14/2019 8:18:06 AM pt states she is not going to call 911 but will have someone take her to the ER Referrals GO TO FACILITY UNDECIDED

## 2019-09-14 NOTE — Telephone Encounter (Signed)
Unable to reach pt, pts mother or father(DPR signed) by phone. Received faxed access nurse copy of call this morning at 8:11 AM. Pt does not appear to be registered at a [facility] ED yet and when access nurse sends over note from portal will add to this call. Will continue to try to reach pt or her parents.I spoke with pt and she is going to go to [facility] ED now;declined 911 but will have someone drive pt to ED. Pt has been vomiting since 09/11/19 and pt has H/A with a pain level now of 8. Pt has not lost consciousness since 09/12/19 but felt like she was going to pass out on 09/13/19 but pt did not lose consciousness. FYI to Pamala Hurry NP.

## 2020-02-29 ENCOUNTER — Encounter: Payer: Self-pay | Admitting: Internal Medicine

## 2020-06-22 ENCOUNTER — Ambulatory Visit: Payer: 59 | Admitting: Dermatology

## 2020-08-30 ENCOUNTER — Ambulatory Visit (INDEPENDENT_AMBULATORY_CARE_PROVIDER_SITE_OTHER): Payer: 59 | Admitting: Dermatology

## 2020-08-30 ENCOUNTER — Other Ambulatory Visit: Payer: Self-pay

## 2020-08-30 DIAGNOSIS — L700 Acne vulgaris: Secondary | ICD-10-CM

## 2020-08-30 DIAGNOSIS — L905 Scar conditions and fibrosis of skin: Secondary | ICD-10-CM | POA: Diagnosis not present

## 2020-08-30 NOTE — Progress Notes (Signed)
   Follow-Up Visit   Subjective  Laura Mora is a 25 y.o. female who presents for the following: Acne (Face x years. She was previously on adapalene 0.3% gel and doxycycline daily for 6 months with no improvement. Not currently using any acne meds.). She has h/o bipolar disease, currently treated.  Getting appt with psychiatrist.   The following portions of the chart were reviewed this encounter and updated as appropriate:       Review of Systems:  No other skin or systemic complaints except as noted in HPI or Assessment and Plan.  Objective  Well appearing patient in no apparent distress; mood and affect are within normal limits.  A focused examination was performed including face. Relevant physical exam findings are noted in the Assessment and Plan.  Objective  face: Multiple inflammatory papules with superficial scarring of the cheeks, chin; closed comedones.   Assessment & Plan  Acne vulgaris face  Severe acne with scarring Discussed isotretinoin and side effects.   Reviewed potential side effects of isotretinoin including xerosis, cheilitis, hepatitis, hyperlipidemia, and severe birth defects if taken by a pregnant woman. Reviewed reports of suicidal ideation in those with a history of depression while taking isotretinoin and reports of diagnosis of inflammatory bowl disease while taking isotretinoin as well as the lack of evidence for a causal relationship between isotretinoin, depression and IBD. Patient advised to reach out with any questions or concerns. Patient advised not to share pills or donate blood while on treatment or for one month after completing treatment.  Urine pregnancy test performed in office today and was negative.  Patient demonstrates comprehension and confirms she will not get pregnant.   Patient registered in [employer] program. Patient states weight 155 lbs She can't take OCPs.  She will state birth control types at next visit.  Patient has a history  of depression. She is on medicine for bi-polar disorder. She is awaiting appointment to be evaluated.    Return in about 30 days (around 09/29/2020).  ICherlyn Labella, CMA, am acting as scribe for Willeen Niece, MD .  Documentation: I have reviewed the above documentation for accuracy and completeness, and I agree with the above.  Willeen Niece MD

## 2020-10-05 ENCOUNTER — Ambulatory Visit (INDEPENDENT_AMBULATORY_CARE_PROVIDER_SITE_OTHER): Payer: 59 | Admitting: Dermatology

## 2020-10-05 ENCOUNTER — Other Ambulatory Visit: Payer: Self-pay

## 2020-10-05 DIAGNOSIS — L700 Acne vulgaris: Secondary | ICD-10-CM | POA: Diagnosis not present

## 2020-10-05 NOTE — Patient Instructions (Addendum)
Reviewed potential side effects of isotretinoin including xerosis, cheilitis, hepatitis, hyperlipidemia, and severe birth defects if taken by a pregnant woman. Reviewed reports of suicidal ideation in those with a history of depression while taking isotretinoin and reports of diagnosis of inflammatory bowl disease while taking isotretinoin as well as the lack of evidence for a causal relationship between isotretinoin, depression and IBD. Patient advised to reach out with any questions or concerns. Patient advised not to share pills or donate blood while on treatment or for one month after completing treatment.     If you have any questions or concerns for your doctor, please call our main line at [phone] and press option 4 to reach your doctor's medical assistant. If no one answers, please leave a voicemail as directed and we will return your call as soon as possible. Messages left after 4 pm will be answered the following business day.   You may also send us a message via MyChart. We typically respond to MyChart messages within 1-2 business days.  For prescription refills, please ask your pharmacy to contact our office. Our fax number is [phone].  If you have an urgent issue when the clinic is closed that cannot wait until the next business day, you can page your doctor at the number below.    Please note that while we do our best to be available for urgent issues outside of office hours, we are not available 24/7.   If you have an urgent issue and are unable to reach us, you may choose to seek medical care at your doctor's office, retail clinic, urgent care center, or emergency room.  If you have a medical emergency, please immediately call 911 or go to the emergency department.  Pager Numbers  - Dr. Kowalski: [phone]  - Dr. Moye: [phone]  - Dr. Stewart: [phone]  In the event of inclement weather, please call our main line at [phone] for an update on the  status of any delays or closures.  Dermatology Medication Tips: Please keep the boxes that topical medications come in in order to help keep track of the instructions about where and how to use these. Pharmacies typically print the medication instructions only on the boxes and not directly on the medication tubes.   If your medication is too expensive, please contact our office at [phone] option 4 or send us a message through MyChart.   We are unable to tell what your co-pay for medications will be in advance as this is different depending on your insurance coverage. However, we may be able to find a substitute medication at lower cost or fill out paperwork to get insurance to cover a needed medication.   If a prior authorization is required to get your medication covered by your insurance company, please allow us 1-2 business days to complete this process.  Drug prices often vary depending on where the prescription is filled and some pharmacies may offer cheaper prices.  The website www.goodrx.com contains coupons for medications through different pharmacies. The prices here do not account for what the cost may be with help from insurance (it may be cheaper with your insurance), but the website can give you the price if you did not use any insurance.  - You can print the associated coupon and take it with your prescription to the pharmacy.  - You may also stop by our office during regular business hours and pick up a GoodRx coupon card.  - If you need your prescription   sent electronically to a different pharmacy, notify our office through [place] MyChart or by phone at [phone] option 4.  

## 2020-10-05 NOTE — Progress Notes (Signed)
   Follow-Up Visit   Subjective  Laura Mora is a 25 y.o. female who presents for the following: Acne (Face x years. Patient here to start isotretinoin today. ).    The following portions of the chart were reviewed this encounter and updated as appropriate:       Review of Systems:  No other skin or systemic complaints except as noted in HPI or Assessment and Plan.  Objective  Well appearing patient in no apparent distress; mood and affect are within normal limits.  A focused examination was performed including face. Relevant physical exam findings are noted in the Assessment and Plan.  Objective  face: Violaceous macules and scarring on cheeks; inflamed comedones and cysts on jaw and chin; inflamed comedones on forehead.   Assessment & Plan  Acne vulgaris face  Acne is chronic, severe.  Reviewed potential side effects of isotretinoin including xerosis, cheilitis, hepatitis, hyperlipidemia, and severe birth defects if taken by a pregnant woman. Reviewed reports of suicidal ideation in those with a history of depression while taking isotretinoin and reports of diagnosis of inflammatory bowl disease while taking isotretinoin as well as the lack of evidence for a causal relationship between isotretinoin, depression and IBD. Patient advised to reach out with any questions or concerns. Patient advised not to share pills or donate blood while on treatment or for one month after completing treatment.  iPLEDGE #[id number] 155 lbs Abstinence  Pending labs, start Absorica 20mg  take 1 po QD dsp #30 0Rf to [facility]. Pt has anxiety which is currently treated and under control.  Will monitor for any worsening.   While taking Isotretinoin and for 30 days after you finish the medication, do not get pregnant, do not share pills, do not donate blood. Isotretinoin is best absorbed when taken with a fatty meal. Isotretinoin can make you sensitive to the sun. Daily careful sun protection  including sunscreen SPF 30+ when outdoors is recommended.    Other Related Procedures Comprehensive metabolic panel Lipid panel hCG, serum, qualitative  Return in about 1 month (around 11/04/2020) for isotretinoin.   I5/22/2022, CMA, am acting as scribe for Cherlyn Labella, MD .  Documentation: I have reviewed the above documentation for accuracy and completeness, and I agree with the above.  Willeen Niece MD

## 2020-10-06 ENCOUNTER — Telehealth: Payer: Self-pay

## 2020-10-06 DIAGNOSIS — L700 Acne vulgaris: Secondary | ICD-10-CM

## 2020-10-06 LAB — LIPID PANEL
Chol/HDL Ratio: 2.3 ratio (ref 0.0–4.4)
Cholesterol, Total: 162 mg/dL (ref 100–199)
HDL: 72 mg/dL (ref >39)
LDL Chol Calc (NIH): 81 mg/dL (ref 0–99)
Triglycerides: 42 mg/dL (ref 0–149)
VLDL Cholesterol Cal: 9 mg/dL (ref 5–40)

## 2020-10-06 LAB — COMPREHENSIVE METABOLIC PANEL
ALT: 13 IU/L (ref 0–32)
AST: 18 IU/L (ref 0–40)
Albumin/Globulin Ratio: 1.7 (ref 1.2–2.2)
Albumin: 4.6 g/dL (ref 3.9–5.0)
Alkaline Phosphatase: 78 IU/L (ref 44–121)
BUN/Creatinine Ratio: 13 (ref 9–23)
BUN: 9 mg/dL (ref 6–20)
Bilirubin Total: 0.4 mg/dL (ref 0.0–1.2)
CO2: 22 mmol/L (ref 20–29)
Calcium: 9.7 mg/dL (ref 8.7–10.2)
Chloride: 101 mmol/L (ref 96–106)
Creatinine, Ser: 0.72 mg/dL (ref 0.57–1.00)
Globulin, Total: 2.7 g/dL (ref 1.5–4.5)
Glucose: 83 mg/dL (ref 65–99)
Potassium: 4.7 mmol/L (ref 3.5–5.2)
Sodium: 138 mmol/L (ref 134–144)
Total Protein: 7.3 g/dL (ref 6.0–8.5)
eGFR: 120 mL/min/{1.73_m2} (ref >59)

## 2020-10-06 LAB — HCG, SERUM, QUALITATIVE: hCG,Beta Subunit,Qual,Serum: NEGATIVE m[IU]/mL (ref <6)

## 2020-10-06 MED ORDER — ISOTRETINOIN 20 MG PO CAPS: 20.0000 mg | ORAL_CAPSULE | Freq: Every day | ORAL | 0 refills | Status: DC

## 2020-10-06 NOTE — Telephone Encounter (Signed)
-----   Message from Willeen Niece, MD sent at 10/06/2020 11:48 AM EDT ----- Labs okay, preg neg.  Start Absorica as per note

## 2020-10-06 NOTE — Telephone Encounter (Signed)
-----   Message from Tara Stewart, MD sent at 10/06/2020 11:48 AM EDT ----- Labs okay, preg neg.  Start Absorica as per note 

## 2020-10-06 NOTE — Telephone Encounter (Signed)
Left message on voicemail to return my call. Absorica 20mg  po QD sent to [facility] and patient confirmed in [place] program.

## 2020-10-06 NOTE — Telephone Encounter (Signed)
Patient informed of lab results and to answer questions on IPledge so the pharmacy can release her Absorica.

## 2020-10-06 NOTE — Telephone Encounter (Signed)
Left pt's mother a message that we left pt message labs ok and Absorica 20mg  1 po qd #30 0rf sent to [facility], and pt needs to go to the [facility] program and demonstrate comprehension.   Advised this needs to be done in the 7 day window of labs being drawn or pt will be locked out.  Advised to call if any questions./sh

## 2020-11-07 ENCOUNTER — Other Ambulatory Visit: Payer: Self-pay

## 2020-11-07 ENCOUNTER — Ambulatory Visit (INDEPENDENT_AMBULATORY_CARE_PROVIDER_SITE_OTHER): Payer: 59 | Admitting: Dermatology

## 2020-11-07 VITALS — Wt 155.0 lb

## 2020-11-07 DIAGNOSIS — K130 Diseases of lips: Secondary | ICD-10-CM | POA: Diagnosis not present

## 2020-11-07 DIAGNOSIS — Z79899 Other long term (current) drug therapy: Secondary | ICD-10-CM | POA: Diagnosis not present

## 2020-11-07 DIAGNOSIS — L853 Xerosis cutis: Secondary | ICD-10-CM | POA: Diagnosis not present

## 2020-11-07 DIAGNOSIS — L700 Acne vulgaris: Secondary | ICD-10-CM

## 2020-11-07 MED ORDER — ISOTRETINOIN 30 MG PO CAPS: 30.0000 mg | ORAL_CAPSULE | Freq: Every day | ORAL | 0 refills | Status: DC

## 2020-11-07 NOTE — Progress Notes (Signed)
   Isotretinoin Follow-Up Visit   Subjective  Laura Mora is a 25 y.o. female who presents for the following: Acne (Wk #4 Isotretinoin 20mg  1 po QD. ). Patient has anxiety/depression and she sees therapist weekly. She has noticed an increase in depression this month, but she's isn't sure if it is related to medicine or the increase in stress at work.   Week # 4   Isotretinoin F/U - 11/07/20 0800      Isotretinoin Follow Up   iPledge # 11/09/20    Date 11/07/20    Weight 155 lb (70.3 kg)    Two Forms of Birth Control Abstinence    Acne breakouts since last visit? Yes      Dosage   Current (To Date) Dosage (mg) 600 mg      Side Effects   Skin Chapped Lips;Dry Lips;Dry Skin    Gastrointestinal WNL    Neurological Headache;Depression    Constitutional WNL           Side effects: Dry skin, dry lips  Denies changes in night vision, shortness of breath, abdominal pain, nausea, vomiting, diarrhea, blood in stool or urine, visual changes, headaches, epistaxis, joint pain, myalgias, mood changes, depression, or suicidal ideation.   Patient is not pregnant, not seeking pregnancy, and not breastfeeding.   The following portions of the chart were reviewed this encounter and updated as appropriate: medications, allergies, medical history  Review of Systems:  No other skin or systemic complaints except as noted in HPI or Assessment and Plan.  Objective  Well appearing patient in no apparent distress; mood and affect are within normal limits.  An examination of the face, neck, chest, and back was performed and relevant findings are noted below.   Objective  face: Multiple inflammatory papules, some cystic, on right chin; multiple open and closed comedones perioral, forehead; some resolving inflammatory papules on forehead, cheeks, perioral.   Assessment & Plan   Acne vulgaris face  Severe and Chronic; currently on Isotretinoin and not to goal  Wk #4 Abstinence [facility]  Urine pregnancy test performed in office today and was negative.  Patient demonstrates comprehension and confirms she will not get pregnant.   Increase isotretinoin 30mg  take 1 po QD with food dsp #30 0Rf.  Patient confirmed in iPledge and isotretinoin sent to pharmacy.  Pt has anxiety which has slightly increased over the past month. She will monitor and keep weekly appointments with therapist. If worsens, will decrease isotretinoin 30mg  to QOD.  ISOtretinoin (ACCUTANE) 30 MG capsule - face   Xerosis secondary to isotretinoin therapy - Continue emollients as directed  Cheilitis secondary to isotretinoin therapy - Continue lip balm as directed, Dr. 11/09/20 Cortibalm recommended  Long term medication management (isotretinoin) - While taking Isotretinoin and for 30 days after you finish the medication, do not get pregnant, do not share pills, do not donate blood. Isotretinoin is best absorbed when taken with a fatty meal. Isotretinoin can make you sensitive to the sun. Daily careful sun protection including sunscreen SPF 30+ when outdoors is recommended.  Follow-up in 30 days.  I , CMA, am acting as scribe for , MD .  Documentation: I have reviewed the above documentation for accuracy and completeness, and I agree with the above.  Clayborne Artist MD

## 2020-11-07 NOTE — Patient Instructions (Signed)
While taking Isotretinoin and for 30 days after you finish the medication, do not get pregnant, do not share pills, do not donate blood. Isotretinoin is best absorbed when taken with a fatty meal. Isotretinoin can make you sensitive to the sun. Daily careful sun protection including sunscreen SPF 30+ when outdoors is recommended.  If you have any questions or concerns for your doctor, please call our main line at [phone] and press option 4 to reach your doctor's medical assistant. If no one answers, please leave a voicemail as directed and we will return your call as soon as possible. Messages left after 4 pm will be answered the following business day.   You may also send us a message via MyChart. We typically respond to MyChart messages within 1-2 business days.  For prescription refills, please ask your pharmacy to contact our office. Our fax number is [phone].  If you have an urgent issue when the clinic is closed that cannot wait until the next business day, you can page your doctor at the number below.    Please note that while we do our best to be available for urgent issues outside of office hours, we are not available 24/7.   If you have an urgent issue and are unable to reach us, you may choose to seek medical care at your doctor's office, retail clinic, urgent care center, or emergency room.  If you have a medical emergency, please immediately call 911 or go to the emergency department.  Pager Numbers  - Dr. Kowalski: [phone]  - Dr. Moye: [phone]  - Dr. Stewart: [phone]  In the event of inclement weather, please call our main line at [phone] for an update on the status of any delays or closures.  Dermatology Medication Tips: Please keep the boxes that topical medications come in in order to help keep track of the instructions about where and how to use these. Pharmacies typically print the medication instructions only on the boxes and not directly  on the medication tubes.   If your medication is too expensive, please contact our office at [phone] option 4 or send us a message through MyChart.   We are unable to tell what your co-pay for medications will be in advance as this is different depending on your insurance coverage. However, we may be able to find a substitute medication at lower cost or fill out paperwork to get insurance to cover a needed medication.   If a prior authorization is required to get your medication covered by your insurance company, please allow us 1-2 business days to complete this process.  Drug prices often vary depending on where the prescription is filled and some pharmacies may offer cheaper prices.  The website www.goodrx.com contains coupons for medications through different pharmacies. The prices here do not account for what the cost may be with help from insurance (it may be cheaper with your insurance), but the website can give you the price if you did not use any insurance.  - You can print the associated coupon and take it with your prescription to the pharmacy.  - You may also stop by our office during regular business hours and pick up a GoodRx coupon card.  - If you need your prescription sent electronically to a different pharmacy, notify our office through [facility] MyChart or by phone at [phone] option 4.  

## 2020-12-12 ENCOUNTER — Ambulatory Visit (INDEPENDENT_AMBULATORY_CARE_PROVIDER_SITE_OTHER): Payer: 59 | Admitting: Dermatology

## 2020-12-12 ENCOUNTER — Other Ambulatory Visit: Payer: Self-pay

## 2020-12-12 VITALS — Wt 155.0 lb

## 2020-12-12 DIAGNOSIS — L853 Xerosis cutis: Secondary | ICD-10-CM

## 2020-12-12 DIAGNOSIS — Z79899 Other long term (current) drug therapy: Secondary | ICD-10-CM

## 2020-12-12 DIAGNOSIS — L700 Acne vulgaris: Secondary | ICD-10-CM | POA: Diagnosis not present

## 2020-12-12 DIAGNOSIS — K130 Diseases of lips: Secondary | ICD-10-CM

## 2020-12-12 MED ORDER — ISOTRETINOIN 30 MG PO CAPS: 30.0000 mg | ORAL_CAPSULE | Freq: Every day | ORAL | 0 refills | Status: DC

## 2020-12-12 NOTE — Patient Instructions (Signed)
Acne is chronic, severe, not at goal.  While taking Isotretinoin and for 30 days after you finish the medication, do not get pregnant, do not share pills, do not donate blood. Isotretinoin is best absorbed when taken with a fatty meal. Isotretinoin can make you sensitive to the sun. Daily careful sun protection including sunscreen SPF 30+ when outdoors is recommended.  If you have any questions or concerns for your doctor, please call our main line at [phone] and press option 4 to reach your doctor's medical assistant. If no one answers, please leave a voicemail as directed and we will return your call as soon as possible. Messages left after 4 pm will be answered the following business day.   You may also send us a message via MyChart. We typically respond to MyChart messages within 1-2 business days.  For prescription refills, please ask your pharmacy to contact our office. Our fax number is [phone].  If you have an urgent issue when the clinic is closed that cannot wait until the next business day, you can page your doctor at the number below.    Please note that while we do our best to be available for urgent issues outside of office hours, we are not available 24/7.   If you have an urgent issue and are unable to reach us, you may choose to seek medical care at your doctor's office, retail clinic, urgent care center, or emergency room.  If you have a medical emergency, please immediately call 911 or go to the emergency department.  Pager Numbers  - Dr. Kowalski: [phone]  - Dr. Moye: [phone]  - Dr. Stewart: [phone]  In the event of inclement weather, please call our main line at [phone] for an update on the status of any delays or closures.  Dermatology Medication Tips: Please keep the boxes that topical medications come in in order to help keep track of the instructions about where and how to use these. Pharmacies typically print the medication  instructions only on the boxes and not directly on the medication tubes.   If your medication is too expensive, please contact our office at [phone] option 4 or send us a message through MyChart.   We are unable to tell what your co-pay for medications will be in advance as this is different depending on your insurance coverage. However, we may be able to find a substitute medication at lower cost or fill out paperwork to get insurance to cover a needed medication.   If a prior authorization is required to get your medication covered by your insurance company, please allow us 1-2 business days to complete this process.  Drug prices often vary depending on where the prescription is filled and some pharmacies may offer cheaper prices.  The website www.goodrx.com contains coupons for medications through different pharmacies. The prices here do not account for what the cost may be with help from insurance (it may be cheaper with your insurance), but the website can give you the price if you did not use any insurance.  - You can print the associated coupon and take it with your prescription to the pharmacy.  - You may also stop by our office during regular business hours and pick up a GoodRx coupon card.  - If you need your prescription sent electronically to a different pharmacy, notify our office through [facility] MyChart or by phone at [phone] option 4.  

## 2020-12-12 NOTE — Progress Notes (Signed)
Isotretinoin Follow-Up Visit   Subjective  Laura Mora is a 25 y.o. female who presents for the following: Acne (Wk #8. Isotretinoin 30mg  QD. Patient has depression and she and her therapist have noticed an increase in her depression. Her therapist thinks it is ok for patient to remain at current dose if patient remains vocal about her thoughts and actions. Patient feels she is ok to continue at current dose. ).  Week # 8   Isotretinoin F/U - 12/12/20 0800       Isotretinoin Follow Up   iPledge # 12/14/20    Date 12/12/20    Weight 155 lb (70.3 kg)    Two Forms of Birth Control Abstinence    Acne breakouts since last visit? Yes      Dosage   Current (To Date) Dosage (mg) 1500 mg      Side Effects   Skin Chapped Lips    Gastrointestinal Nausea    Neurological Depression;Headache    Constitutional Muscle/joint aches                Side effects: Dry skin, dry lips  Denies changes in night vision, shortness of breath, abdominal pain, nausea, vomiting, diarrhea, blood in stool or urine, visual changes, headaches, epistaxis, joint pain, myalgias, mood changes, depression, or suicidal ideation.   Patient is not pregnant, not seeking pregnancy, and not breastfeeding.   The following portions of the chart were reviewed this encounter and updated as appropriate: medications, allergies, medical history  Review of Systems:  No other skin or systemic complaints except as noted in HPI or Assessment and Plan.  Objective  Well appearing patient in no apparent distress; mood and affect are within normal limits.  An examination of the face, neck, chest, and back was performed and relevant findings are noted below.   face Multiple inflammatory papules on the R jaw, lower cheek; multiple closed comedones perioral, cheeks, forehead.   Assessment & Plan   Acne vulgaris face  Wk #8 [facility] Abstinence  Urine pregnancy test performed in office today and was  negative.  Patient demonstrates comprehension and confirms she will not get pregnant.   Will stay at current dose due to patient's depression. Pt is regularly seeing therapist to manage any worsening symptoms.  Patient confirmed in iPledge and isotretinoin sent to pharmacy.  Continue Absorica 30mg  take 1 po QD with food dsp #30 0Rf.  Total mg to date - 1,500 mg Total mg/kg - 21.34 mg/kg     ISOtretinoin (ABSORICA) 30 MG capsule - face Take 1 capsule (30 mg total) by mouth daily. Take with food.  Related Medications ISOtretinoin (ACCUTANE) 30 MG capsule Take 1 capsule (30 mg total) by mouth daily. Take with food.   Xerosis secondary to isotretinoin therapy - Continue emollients as directed  Cheilitis secondary to isotretinoin therapy - Continue lip balm as directed, Dr. 12/14/20 Cortibalm recommended  Long term medication management (isotretinoin) - While taking Isotretinoin and for 30 days after you finish the medication, do not get pregnant, do not share pills, do not donate blood. Isotretinoin is best absorbed when taken with a fatty meal. Isotretinoin can make you sensitive to the sun. Daily careful sun protection including sunscreen SPF 30+ when outdoors is recommended.  Follow-up in 30 days.  I , CMA, am acting as scribe for Clayborne Artist, MD . Documentation: I have reviewed the above documentation for accuracy and completeness, and I agree with the above.  Cherlyn Labella MD

## 2021-01-11 ENCOUNTER — Encounter: Payer: Self-pay | Admitting: Dermatology

## 2021-01-11 ENCOUNTER — Ambulatory Visit (INDEPENDENT_AMBULATORY_CARE_PROVIDER_SITE_OTHER): Payer: 59 | Admitting: Dermatology

## 2021-01-11 ENCOUNTER — Other Ambulatory Visit: Payer: Self-pay

## 2021-01-11 VITALS — Wt 155.0 lb

## 2021-01-11 DIAGNOSIS — L853 Xerosis cutis: Secondary | ICD-10-CM | POA: Diagnosis not present

## 2021-01-11 DIAGNOSIS — L700 Acne vulgaris: Secondary | ICD-10-CM | POA: Diagnosis not present

## 2021-01-11 DIAGNOSIS — Z79899 Other long term (current) drug therapy: Secondary | ICD-10-CM

## 2021-01-11 DIAGNOSIS — K130 Diseases of lips: Secondary | ICD-10-CM

## 2021-01-11 MED ORDER — ISOTRETINOIN 40 MG PO CAPS: 40.0000 mg | ORAL_CAPSULE | Freq: Every day | ORAL | 0 refills | Status: DC

## 2021-01-11 NOTE — Patient Instructions (Signed)
While taking Isotretinoin and for 30 days after you finish the medication, do not get pregnant, do not share pills, do not donate blood. Isotretinoin is best absorbed when taken with a fatty meal. Isotretinoin can make you sensitive to the sun. Daily careful sun protection including sunscreen SPF 30+ when outdoors is recommended.  If you have any questions or concerns for your doctor, please call our main line at [phone] and press option 4 to reach your doctor's medical assistant. If no one answers, please leave a voicemail as directed and we will return your call as soon as possible. Messages left after 4 pm will be answered the following business day.   You may also send us a message via MyChart. We typically respond to MyChart messages within 1-2 business days.  For prescription refills, please ask your pharmacy to contact our office. Our fax number is [phone].  If you have an urgent issue when the clinic is closed that cannot wait until the next business day, you can page your doctor at the number below.    Please note that while we do our best to be available for urgent issues outside of office hours, we are not available 24/7.   If you have an urgent issue and are unable to reach us, you may choose to seek medical care at your doctor's office, retail clinic, urgent care center, or emergency room.  If you have a medical emergency, please immediately call 911 or go to the emergency department.  Pager Numbers  - Dr. Kowalski: [phone]  - Dr. Moye: [phone]  - Dr. Stewart: [phone]  In the event of inclement weather, please call our main line at [phone] for an update on the status of any delays or closures.  Dermatology Medication Tips: Please keep the boxes that topical medications come in in order to help keep track of the instructions about where and how to use these. Pharmacies typically print the medication instructions only on the boxes and not directly  on the medication tubes.   If your medication is too expensive, please contact our office at [phone] option 4 or send us a message through MyChart.   We are unable to tell what your co-pay for medications will be in advance as this is different depending on your insurance coverage. However, we may be able to find a substitute medication at lower cost or fill out paperwork to get insurance to cover a needed medication.   If a prior authorization is required to get your medication covered by your insurance company, please allow us 1-2 business days to complete this process.  Drug prices often vary depending on where the prescription is filled and some pharmacies may offer cheaper prices.  The website www.goodrx.com contains coupons for medications through different pharmacies. The prices here do not account for what the cost may be with help from insurance (it may be cheaper with your insurance), but the website can give you the price if you did not use any insurance.  - You can print the associated coupon and take it with your prescription to the pharmacy.  - You may also stop by our office during regular business hours and pick up a GoodRx coupon card.  - If you need your prescription sent electronically to a different pharmacy, notify our office through [facility] MyChart or by phone at [phone] option 4.  

## 2021-01-11 NOTE — Progress Notes (Signed)
   Isotretinoin Follow-Up Visit   Subjective  Laura Mora is a 25 y.o. female who presents for the following: Acne (Accutane recheck. Had breakouts last couple of weeks. Suicide ideation has resolved. Decreased appetite and nausea. ).  Week # 12   Isotretinoin F/U - 01/11/21 0800       Isotretinoin Follow Up   iPledge # [id number]    Date 01/11/21    Weight 155 lb (70.3 kg)    Two Forms of Birth Control Abstinence    Acne breakouts since last visit? Yes      Dosage   Current (To Date) Dosage (mg) 2400 mg      Side Effects   Skin Chapped Lips;Dry Lips;Dry Skin;Dry Nose    Gastrointestinal Abdominal Pain;Change in Appetite;Nausea    Neurological Depression;Dizziness;Mood Swings    Constitutional Muscle/joint aches             Side effects: Dry skin, dry lips  Denies changes in night vision, shortness of breath, abdominal pain, nausea, vomiting, diarrhea, blood in stool or urine, visual changes, headaches, epistaxis, joint pain, myalgias, mood changes, depression, or suicidal ideation.   Patient is not pregnant, not seeking pregnancy, and not breastfeeding.   The following portions of the chart were reviewed this encounter and updated as appropriate: medications, allergies, medical history  Review of Systems:  No other skin or systemic complaints except as noted in HPI or Assessment and Plan.  Objective  Well appearing patient in no apparent distress; mood and affect are within normal limits.  An examination of the face, neck, chest, and back was performed and relevant findings are noted below.   face Scattered resolving pink papules/macules at cheeks. Crusted excoriation at R forehead. Comedones at forehead.    Assessment & Plan   Acne vulgaris face  Severe and Chronic (present >1 year); currently on Isotretinoin and not to goal  Week #12  Total mg to date 2400 mg.  34.14mg /kg  [place]. Abstinence. Depression is well-controlled, will increase to  Accutane 40 mg PO qd with food.  UPT performed in office; results negative.  Patient confirmed in iPledge and isotretinoin sent to pharmacy.   Will review labs from PCP when available in next week or so.  ISOtretinoin (ABSORICA) 40 MG capsule - face Take 1 capsule (40 mg total) by mouth daily. With food   Xerosis secondary to isotretinoin therapy - Continue emollients as directed  Cheilitis secondary to isotretinoin therapy - Continue lip balm as directed, Dr. Clayborne Artist Cortibalm recommended  Long term medication management (isotretinoin) - While taking Isotretinoin and for 30 days after you finish the medication, do not get pregnant, do not share pills, do not donate blood. Isotretinoin is best absorbed when taken with a fatty meal. Isotretinoin can make you sensitive to the sun. Daily careful sun protection including sunscreen SPF 30+ when outdoors is recommended.  Follow-up in 30 days.  I, Lawson Radar, CMA, am acting as scribe for Willeen Niece, MD.  Documentation: I have reviewed the above documentation for accuracy and completeness, and I agree with the above.  Willeen Niece MD

## 2021-02-15 ENCOUNTER — Ambulatory Visit (INDEPENDENT_AMBULATORY_CARE_PROVIDER_SITE_OTHER): Payer: 59 | Admitting: Dermatology

## 2021-02-15 ENCOUNTER — Other Ambulatory Visit: Payer: Self-pay

## 2021-02-15 VITALS — Wt 155.0 lb

## 2021-02-15 DIAGNOSIS — L853 Xerosis cutis: Secondary | ICD-10-CM

## 2021-02-15 DIAGNOSIS — K130 Diseases of lips: Secondary | ICD-10-CM

## 2021-02-15 DIAGNOSIS — L700 Acne vulgaris: Secondary | ICD-10-CM

## 2021-02-15 DIAGNOSIS — Z79899 Other long term (current) drug therapy: Secondary | ICD-10-CM

## 2021-02-15 MED ORDER — ISOTRETINOIN 30 MG PO CAPS: 30.0000 mg | ORAL_CAPSULE | Freq: Every day | ORAL | 0 refills | Status: DC

## 2021-02-15 NOTE — Patient Instructions (Signed)
While taking Isotretinoin and for 30 days after you finish the medication, do not get pregnant, do not share pills, do not donate blood. Isotretinoin is best absorbed when taken with a fatty meal. Isotretinoin can make you sensitive to the sun. Daily careful sun protection including sunscreen SPF 30+ when outdoors is recommended.  If you have any questions or concerns for your doctor, please call our main line at [phone] and press option 4 to reach your doctor's medical assistant. If no one answers, please leave a voicemail as directed and we will return your call as soon as possible. Messages left after 4 pm will be answered the following business day.   You may also send us a message via MyChart. We typically respond to MyChart messages within 1-2 business days.  For prescription refills, please ask your pharmacy to contact our office. Our fax number is [phone].  If you have an urgent issue when the clinic is closed that cannot wait until the next business day, you can page your doctor at the number below.    Please note that while we do our best to be available for urgent issues outside of office hours, we are not available 24/7.   If you have an urgent issue and are unable to reach us, you may choose to seek medical care at your doctor's office, retail clinic, urgent care center, or emergency room.  If you have a medical emergency, please immediately call 911 or go to the emergency department.  Pager Numbers  - Dr. Kowalski: [phone]  - Dr. Moye: [phone]  - Dr. Stewart: [phone]  In the event of inclement weather, please call our main line at [phone] for an update on the status of any delays or closures.  Dermatology Medication Tips: Please keep the boxes that topical medications come in in order to help keep track of the instructions about where and how to use these. Pharmacies typically print the medication instructions only on the boxes and not directly  on the medication tubes.   If your medication is too expensive, please contact our office at [phone] option 4 or send us a message through MyChart.   We are unable to tell what your co-pay for medications will be in advance as this is different depending on your insurance coverage. However, we may be able to find a substitute medication at lower cost or fill out paperwork to get insurance to cover a needed medication.   If a prior authorization is required to get your medication covered by your insurance company, please allow us 1-2 business days to complete this process.  Drug prices often vary depending on where the prescription is filled and some pharmacies may offer cheaper prices.  The website www.goodrx.com contains coupons for medications through different pharmacies. The prices here do not account for what the cost may be with help from insurance (it may be cheaper with your insurance), but the website can give you the price if you did not use any insurance.  - You can print the associated coupon and take it with your prescription to the pharmacy.  - You may also stop by our office during regular business hours and pick up a GoodRx coupon card.  - If you need your prescription sent electronically to a different pharmacy, notify our office through [facility] MyChart or by phone at [phone] option 4.  

## 2021-02-15 NOTE — Progress Notes (Signed)
   Isotretinoin Follow-Up Visit   Subjective  Laura Mora is a 25 y.o. female who presents for the following: Acne (Wk #16 isotretinoin 40mg . Patient was increased last visit. When she started the increased dose, after 1 1/2 weeks she started developing scabs all over face. She decreased med to QOD and areas started to improve. ).  Depression was situational and is well-controlled.  No worsening.  Week # 16   Isotretinoin F/U - 02/15/21 0800       Isotretinoin Follow Up   iPledge # 02/17/21    Date 02/15/21    Weight 155 lb (70.3 kg)    Two Forms of Birth Control Abstinence    Acne breakouts since last visit? No      Dosage   Current (To Date) Dosage (mg) 3600 mg      Side Effects   Skin Chapped Lips;Dry Lips;Dry Eyes;Dry Nose;Dry Skin    Gastrointestinal Abdominal Pain;Change in Appetite;Nausea    Neurological Depression;Dizziness;Headache;Mood Swings   Constitutional Muscle/joint aches;Fatigue             Side effects: Dry skin, dry lips  Denies changes in night vision, shortness of breath, abdominal pain, nausea, vomiting, diarrhea, blood in stool or urine, visual changes, headaches, epistaxis, joint pain, myalgias, mood changes, depression, or suicidal ideation.   Patient is not pregnant, not seeking pregnancy, and not breastfeeding.   The following portions of the chart were reviewed this encounter and updated as appropriate: medications, allergies, medical history  Review of Systems:  No other skin or systemic complaints except as noted in HPI or Assessment and Plan.  Objective  Well appearing patient in no apparent distress; mood and affect are within normal limits.  An examination of the face, neck, chest, and back was performed and relevant findings are noted below.   face Resolving cyst on the left lower cheek; hyperpigmented macules; pink scaly macules on cheeks and forehead   Assessment & Plan   Acne vulgaris face  Severe; On Isotretinoin -   requiring FDA mandated monthly evaluations and laboratory monitoring; Chronic and Persistent; Not to Goal Depression is well-controlled per patient.  Wk #16 Abstinence [place]  Urine pregnancy test performed in office today and was negative.  Patient demonstrates comprehension and confirms she will not get pregnant.   Will finish 40mg  taking 1 po QOD until finished, then decrease isotretinoin 30mg  take 1 po QD dsp #30 0Rf due to dryness/side effects.   Patient confirmed in iPledge and isotretinoin sent to pharmacy.  Total mg to date - 3600 mg Total mg/kg - 51.21 mg/kg  Plan labs on f/u.  ISOtretinoin (ACCUTANE) 30 MG capsule - face Take 1 capsule (30 mg total) by mouth daily. Take with food.   Xerosis secondary to isotretinoin therapy - Continue emollients as directed  Cheilitis secondary to isotretinoin therapy - Continue lip balm as directed, Dr. 02/17/21 Cortibalm recommended  Long term medication management (isotretinoin) - While taking Isotretinoin and for 30 days after you finish the medication, do not get pregnant, do not share pills, do not donate blood. Isotretinoin is best absorbed when taken with a fatty meal. Isotretinoin can make you sensitive to the sun. Daily careful sun protection including sunscreen SPF 30+ when outdoors is recommended.  Follow-up in 30 days.  I , CMA, am acting as scribe for , MD .  Documentation: I have reviewed the above documentation for accuracy and completeness, and I agree with the above.  Clayborne Artist MD

## 2021-02-23 ENCOUNTER — Ambulatory Visit: Payer: 59

## 2021-02-23 ENCOUNTER — Other Ambulatory Visit: Payer: Self-pay

## 2021-02-23 DIAGNOSIS — L700 Acne vulgaris: Secondary | ICD-10-CM

## 2021-02-23 MED ORDER — ISOTRETINOIN 30 MG PO CAPS: 30.0000 mg | ORAL_CAPSULE | Freq: Every day | ORAL | 0 refills | Status: DC

## 2021-02-23 NOTE — Progress Notes (Signed)
Patient missed window due to finishing previous month of medication.  Urine pregnancy test negative today. Patient re confirmed in ipledge and medication sent in.  Patient was scheduled 6 weeks out last appointment so I will not move her today. Patient to keep 10/18 appointment.

## 2021-03-09 ENCOUNTER — Telehealth: Payer: Self-pay

## 2021-03-09 NOTE — Telephone Encounter (Signed)
Patient called today to advise she is on Isotretinoin and her form of birth control is abstinence.  Pt advised she did have sexual intercourse this past week and she has not taken Isotretinoin since.  I advised pt not to take Isotretinoin until we get back to her next week.  I discussed pt will have to change her birth control methods if she is not going to continue to abstinence.  Pt advised she can not take any hormonal birth controls.  I discussed IUD without hormone (copper IUD) and patient said she would probably not do an IUD because her sisters have had issues with IUDs in the past.  I told pt if she does decide to cont with abstinence for her birth control method she will have another 30 day wait period and 2 negative pregnancy test will be required.  I advised I would forward this to Dr. Roseanne Reno and I would call her back next week.  I advised again to NOT take her Isotretinoin for now until we get back with her.  Advised Isotretinoin can cause birth defects if taken when pregnant./sh

## 2021-04-04 ENCOUNTER — Ambulatory Visit: Payer: 59 | Admitting: Dermatology

## 2021-07-11 ENCOUNTER — Encounter: Payer: Self-pay | Admitting: Emergency Medicine

## 2021-07-11 ENCOUNTER — Emergency Department
Admission: EM | Admit: 2021-07-11 | Discharge: 2021-07-12 | Disposition: A | Discharge status: Other Acute Inpatient Hospital | Payer: 59 | Source: Home / Self Care | Attending: Emergency Medicine | Admitting: Emergency Medicine

## 2021-07-11 ENCOUNTER — Other Ambulatory Visit: Payer: Self-pay

## 2021-07-11 DIAGNOSIS — F329 Major depressive disorder, single episode, unspecified: Secondary | ICD-10-CM | POA: Diagnosis not present

## 2021-07-11 DIAGNOSIS — F509 Eating disorder, unspecified: Secondary | ICD-10-CM | POA: Diagnosis present

## 2021-07-11 DIAGNOSIS — Y900 Blood alcohol level of less than 20 mg/100 ml: Secondary | ICD-10-CM | POA: Insufficient documentation

## 2021-07-11 DIAGNOSIS — R45851 Suicidal ideations: Secondary | ICD-10-CM | POA: Insufficient documentation

## 2021-07-11 DIAGNOSIS — F32A Depression, unspecified: Secondary | ICD-10-CM | POA: Insufficient documentation

## 2021-07-11 DIAGNOSIS — R519 Headache, unspecified: Secondary | ICD-10-CM | POA: Diagnosis present

## 2021-07-11 DIAGNOSIS — F419 Anxiety disorder, unspecified: Secondary | ICD-10-CM | POA: Insufficient documentation

## 2021-07-11 DIAGNOSIS — Z20822 Contact with and (suspected) exposure to covid-19: Secondary | ICD-10-CM | POA: Insufficient documentation

## 2021-07-11 DIAGNOSIS — K5909 Other constipation: Secondary | ICD-10-CM | POA: Diagnosis present

## 2021-07-11 DIAGNOSIS — K219 Gastro-esophageal reflux disease without esophagitis: Secondary | ICD-10-CM | POA: Diagnosis present

## 2021-07-11 HISTORY — DX: Bipolar disorder, unspecified: F31.9

## 2021-07-11 HISTORY — DX: Anxiety disorder, unspecified: F41.9

## 2021-07-11 HISTORY — DX: Depression, unspecified: F32.A

## 2021-07-11 LAB — COMPREHENSIVE METABOLIC PANEL
ALT: 12 U/L (ref 0–44)
AST: 19 U/L (ref 15–41)
Albumin: 4.3 g/dL (ref 3.5–5.0)
Alkaline Phosphatase: 56 U/L (ref 38–126)
Anion gap: 7 (ref 5–15)
BUN: 10 mg/dL (ref 6–20)
CO2: 24 mmol/L (ref 22–32)
Calcium: 8.9 mg/dL (ref 8.9–10.3)
Chloride: 103 mmol/L (ref 98–111)
Creatinine, Ser: 0.47 mg/dL (ref 0.44–1.00)
GFR, Estimated: >60 mL/min (ref >60)
Glucose, Bld: 100 mg/dL — ABNORMAL HIGH (ref 70–99)
Potassium: 3.4 mmol/L — ABNORMAL LOW (ref 3.5–5.1)
Sodium: 134 mmol/L — ABNORMAL LOW (ref 135–145)
Total Bilirubin: 0.7 mg/dL (ref 0.3–1.2)
Total Protein: 7.6 g/dL (ref 6.5–8.1)

## 2021-07-11 LAB — SALICYLATE LEVEL: Salicylate Lvl: <7 mg/dL — ABNORMAL LOW (ref 7.0–30.0)

## 2021-07-11 LAB — URINE DRUG SCREEN, QUALITATIVE (ARMC ONLY)
Amphetamines, Ur Screen: NOT DETECTED
Barbiturates, Ur Screen: NOT DETECTED
Benzodiazepine, Ur Scrn: NOT DETECTED
Cannabinoid 50 Ng, Ur ~~LOC~~: NOT DETECTED
Cocaine Metabolite,Ur ~~LOC~~: NOT DETECTED
MDMA (Ecstasy)Ur Screen: NOT DETECTED
Methadone Scn, Ur: NOT DETECTED
Opiate, Ur Screen: NOT DETECTED
Phencyclidine (PCP) Ur S: NOT DETECTED
Tricyclic, Ur Screen: NOT DETECTED

## 2021-07-11 LAB — CBC
HCT: 37.5 % (ref 36.0–46.0)
Hemoglobin: 12.1 g/dL (ref 12.0–15.0)
MCH: 28.8 pg (ref 26.0–34.0)
MCHC: 32.3 g/dL (ref 30.0–36.0)
MCV: 89.3 fL (ref 80.0–100.0)
Platelets: 378 10*3/uL (ref 150–400)
RBC: 4.2 MIL/uL (ref 3.87–5.11)
RDW: 13.9 % (ref 11.5–15.5)
WBC: 9.9 10*3/uL (ref 4.0–10.5)
nRBC: 0 % (ref 0.0–0.2)

## 2021-07-11 LAB — POC URINE PREG, ED: Preg Test, Ur: NEGATIVE

## 2021-07-11 LAB — ETHANOL: Alcohol, Ethyl (B): <10 mg/dL (ref <10)

## 2021-07-11 LAB — ACETAMINOPHEN LEVEL: Acetaminophen (Tylenol), Serum: <10 ug/mL — ABNORMAL LOW (ref 10–30)

## 2021-07-11 NOTE — ED Provider Notes (Signed)
[facility] Provider Note    Event Date/Time   First MD Initiated Contact with Patient 07/11/21 2120     (approximate)   History   Suicidal   HPI Laura Mora is a 26 y.o. female with a stated past medical history of bipolar and depression who presents voluntarily for psychiatric evaluation after expressing suicidal ideation with a plan to jump off a bridge.  Patient states that she stood in the edge of a bridge for approximately 1.5 hours this evening contemplating jumping but has not.  Patient denies any previous suicide attempts.  Patient does endorse previous hospitalizations.  Patient states that she should be on psychopharmaceutical's but has not been on any for the past few months.  Patient currently denies any homicidal ideation or auditory/visual hallucinations.  Patient does endorse EtOH use this evening   Physical Exam   Triage Vital Signs: ED Triage Vitals  Enc Vitals Group     BP 07/11/21 2110 136/90     Pulse Rate 07/11/21 2110 81     Resp 07/11/21 2110 16     Temp 07/11/21 2110 98.2 F (36.8 C)     Temp Source 07/11/21 2110 Oral     SpO2 07/11/21 2110 100 %     Weight 07/11/21 2111 150 lb (68 kg)     Height 07/11/21 2111 5\' 4"  (1.626 m)     Head Circumference --      Peak Flow --      Pain Score 07/11/21 2111 0     Pain Loc --      Pain Edu? --      Excl. in GC? --     Most recent vital signs: Vitals:   07/11/21 2110  BP: 136/90  Pulse: 81  Resp: 16  Temp: 98.2 F (36.8 C)  SpO2: 100%    General: Awake, no distress.  CV:  Good peripheral perfusion.  Resp:  Normal effort.  Abd:  No distention.  Other:  Calm and cooperative on stretcher.  Good insight   ED Results / Procedures / Treatments   Labs (all labs ordered are listed, but only abnormal results are displayed) Labs Reviewed  COMPREHENSIVE METABOLIC PANEL - Abnormal; Notable for the following components:      Result Value   Sodium 134 (*)    Potassium 3.4 (*)     Glucose, Bld 100 (*)    All other components within normal limits  SALICYLATE LEVEL - Abnormal; Notable for the following components:   Salicylate Lvl <7.0 (*)    All other components within normal limits  ACETAMINOPHEN LEVEL - Abnormal; Notable for the following components:   Acetaminophen (Tylenol), Serum <10 (*)    All other components within normal limits  RESP PANEL BY RT-PCR (FLU A&B, COVID) ARPGX2  ETHANOL  CBC  URINE DRUG SCREEN, QUALITATIVE ([facility] ONLY)  POC URINE PREG, ED   PROCEDURES:  Critical Care performed: No  Procedures   MEDICATIONS ORDERED IN ED: Medications - No data to display   IMPRESSION / MDM / ASSESSMENT AND PLAN / ED COURSE  I reviewed the triage vital signs and the nursing notes.                              The patient is on the cardiac monitor to evaluate for evidence of arrhythmia and/or significant heart rate changes. Thoughts are linear and organized, and patient has no AH, VH,  or HI. Prior suicide attempt by cutting wrists Prior Psychiatric Hospitalizations: Multiple  Clinically patient displays no overt toxidrome; they are well appearing, with low suspicion for toxic ingestion given history and exam. Thoughts unlikely 2/2 anemia, hypothyroidism, infection, or ICH.  Consult: Psychiatry to evaluate patient for potential hold for danger to self. Disposition: Care of this patient will be signed out to the oncoming physician at the end of my shift.  All pertinent patient information conveyed and all questions answered.  All further care and disposition decisions will be made by the oncoming physician.        FINAL CLINICAL IMPRESSION(S) / ED DIAGNOSES   Final diagnoses:  Suicidal ideation     Rx / DC Orders   ED Discharge Orders     None        Note:  This document was prepared using Dragon voice recognition software and may include unintentional dictation errors.   Merwyn Katos, MD 07/11/21 [id number]
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
## 2021-07-11 NOTE — ED Notes (Signed)
[name] (Brother)  [phone] (cell)

## 2021-07-12 ENCOUNTER — Encounter: Payer: Self-pay | Admitting: Psychiatry

## 2021-07-12 ENCOUNTER — Inpatient Hospital Stay
Admission: AD | Admit: 2021-07-12 | Discharge: 2021-07-13 | DRG: 881 | Disposition: A | Discharge status: Home / Self Care | Payer: 59 | Source: Intra-hospital | Attending: Psychiatry | Admitting: Psychiatry

## 2021-07-12 DIAGNOSIS — F4310 Post-traumatic stress disorder, unspecified: Secondary | ICD-10-CM

## 2021-07-12 DIAGNOSIS — F419 Anxiety disorder, unspecified: Secondary | ICD-10-CM | POA: Diagnosis present

## 2021-07-12 DIAGNOSIS — F329 Major depressive disorder, single episode, unspecified: Principal | ICD-10-CM | POA: Diagnosis present

## 2021-07-12 DIAGNOSIS — G4700 Insomnia, unspecified: Secondary | ICD-10-CM | POA: Diagnosis present

## 2021-07-12 DIAGNOSIS — Z20822 Contact with and (suspected) exposure to covid-19: Secondary | ICD-10-CM | POA: Diagnosis present

## 2021-07-12 DIAGNOSIS — R45851 Suicidal ideations: Secondary | ICD-10-CM | POA: Diagnosis present

## 2021-07-12 DIAGNOSIS — F32A Depression, unspecified: Secondary | ICD-10-CM | POA: Diagnosis present

## 2021-07-12 DIAGNOSIS — F332 Major depressive disorder, recurrent severe without psychotic features: Secondary | ICD-10-CM | POA: Diagnosis not present

## 2021-07-12 LAB — RESP PANEL BY RT-PCR (FLU A&B, COVID) ARPGX2
Influenza A by PCR: NEGATIVE
Influenza B by PCR: NEGATIVE
SARS Coronavirus 2 by RT PCR: NEGATIVE

## 2021-07-12 MED ORDER — MAGNESIUM HYDROXIDE 400 MG/5ML PO SUSP: 30.0000 mL | Freq: Every day | ORAL | Status: DC | PRN

## 2021-07-12 MED ORDER — ALUM & MAG HYDROXIDE-SIMETH 200-200-20 MG/5ML PO SUSP: 30.0000 mL | ORAL | Status: DC | PRN

## 2021-07-12 MED ORDER — FLUOXETINE HCL 20 MG PO CAPS
20.0000 mg | ORAL_CAPSULE | Freq: Every day | ORAL | Status: DC
  Administered 2021-07-12 – 2021-07-13 (×2): 20 mg via ORAL
  Filled 2021-07-12 (×2): qty 1

## 2021-07-12 MED ORDER — ACETAMINOPHEN 325 MG PO TABS
650.0000 mg | ORAL_TABLET | Freq: Four times a day (QID) | ORAL | Status: DC | PRN
  Administered 2021-07-13: 650 mg via ORAL
  Filled 2021-07-12: qty 2

## 2021-07-12 NOTE — ED Notes (Signed)
Pt requested shower; provided clean hospital clothing and linens.  Shower setup provided with soap, shampoo, toothbrush/toothpaste, and deoderant.  Pt able to preform own ADL's with no assistance.   ?

## 2021-07-12 NOTE — ED Notes (Signed)
IVC/Pending psych consult. 
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
## 2021-07-12 NOTE — ED Provider Notes (Signed)
Emergency Medicine Observation Re-evaluation Note  Laura Mora is a 26 y.o. female, seen on rounds today.  Pt initially presented to the ED for complaints of Suicidal Currently, the patient is calm, sleeping comfortably without distress.  Physical Exam  BP 136/90 (BP Location: Left Arm)    Pulse 81    Temp 98.2 F (36.8 C) (Oral)    Resp 16    Ht 5\' 4"  (1.626 m)    Wt 68 kg    LMP 06/16/2021    SpO2 100%    BMI 25.75 kg/m  Physical Exam General: Calm, appears to be sleeping pleasantly Cardiac: Appears warm and well-perfused peripherally Lungs: Normal chest rise and fall Psych: Calm  ED Course / MDM  EKG:   I have reviewed the labs performed to date as well as medications administered while in observation.  Recent changes in the last 24 hours include seen by psychiatry, plan for admission to psychiatric service.  Plan  Current plan is for psychiatric admission. Karrina Lye is under involuntary commitment.      Oletta Lamas, MD 07/12/21 [phone]

## 2021-07-12 NOTE — [facility] Suicide Risk Assessment (Signed)
[name] Admission Suicide Risk Assessment   Nursing information obtained from:  Patient Demographic factors:  Caucasian Current Mental Status:  NA Loss Factors:  Financial problems / change in socioeconomic status, Loss of significant relationship Historical Factors:  Impulsivity Risk Reduction Factors:  Living with another person, especially a relative  Total Time spent with patient: 1 hour Principal Problem: MDD (major depressive disorder) Diagnosis:  Principal Problem:   MDD (major depressive disorder) Active Problems:   Anxiety and depression   PTSD (post-traumatic stress disorder)  Subjective Data: Patient seen and chart reviewed.  26 year old woman with a history of severe chronic depression and suicidal ideation came to the hospital after having suicidal thoughts.  Currently continues to endorse depression but denies suicidal intent.  Denies psychotic symptoms.  Currently sees a therapist and is open to increasing treatment.  Cooperative here in the hospital.  Good insight  Continued Clinical Symptoms:  Alcohol Use Disorder Identification Test Final Score (AUDIT): 3 The "Alcohol Use Disorders Identification Test", Guidelines for Use in Primary Care, Second Edition.  World Science writer [facility]). Score between 0-7:  no or low risk or alcohol related problems. Score between 8-15:  moderate risk of alcohol related problems. Score between 16-19:  high risk of alcohol related problems. Score 20 or above:  warrants further diagnostic evaluation for alcohol dependence and treatment.   CLINICAL FACTORS:   Depression:   Anhedonia Hopelessness Insomnia Severe   Musculoskeletal: Strength & Muscle Tone: within normal limits Gait & Station: normal Patient leans: N/A  Psychiatric Specialty Exam:  Presentation  General Appearance: Appropriate for Environment  Eye Contact:Good  Speech:Clear and Coherent  Speech Volume:Normal  Handedness:Right   Mood and Affect   Mood:Depressed  Affect:Blunt; Flat; Depressed   Thought Process  Thought Processes:Coherent  Descriptions of Associations:Intact  Orientation:Full (Time, Place and Person)  Thought Content:Logical  History of Schizophrenia/Schizoaffective disorder:No  Duration of Psychotic Symptoms:No data recorded Hallucinations:Hallucinations: None  Ideas of Reference:None  Suicidal Thoughts:Suicidal Thoughts: No  Homicidal Thoughts:Homicidal Thoughts: No   Sensorium  Memory:Immediate Good; Recent Good; Remote Good  Judgment:Fair  Insight:Fair   Executive Functions  Concentration:Fair  Attention Span:Fair  Recall:Fair  Fund of Knowledge:Fair  Language:Good   Psychomotor Activity  Psychomotor Activity:Psychomotor Activity: Normal   Assets  Assets:Communication Skills; Desire for Improvement; Financial Resources/Insurance; Physical Health; Resilience; Social Support   Sleep  Sleep:Sleep: Poor Number of Hours of Sleep: 3    Physical Exam: Physical Exam Vitals and nursing note reviewed.  Constitutional:      Appearance: Normal appearance.  HENT:     Head: Normocephalic and atraumatic.     Mouth/Throat:     Pharynx: Oropharynx is clear.  Eyes:     Pupils: Pupils are equal, round, and reactive to light.  Cardiovascular:     Rate and Rhythm: Normal rate and regular rhythm.  Pulmonary:     Effort: Pulmonary effort is normal.     Breath sounds: Normal breath sounds.  Abdominal:     General: Abdomen is flat.     Palpations: Abdomen is soft.  Musculoskeletal:        General: Normal range of motion.  Skin:    General: Skin is warm and dry.  Neurological:     General: No focal deficit present.     Mental Status: She is alert. Mental status is at baseline.  Psychiatric:        Attention and Perception: Attention normal.        Mood and Affect: Mood is  anxious and depressed.        Speech: Speech is delayed.        Behavior: Behavior is slowed.         Thought Content: Thought content includes suicidal ideation. Thought content does not include suicidal plan.        Cognition and Memory: Cognition normal.        Judgment: Judgment normal.   Review of Systems  Constitutional: Negative.   HENT: Negative.    Eyes: Negative.   Respiratory: Negative.    Cardiovascular: Negative.   Gastrointestinal: Negative.   Musculoskeletal: Negative.   Skin: Negative.   Neurological: Negative.   Psychiatric/Behavioral:  Positive for depression and suicidal ideas. Negative for hallucinations and substance abuse. The patient is nervous/anxious and has insomnia.   Blood pressure 115/82, pulse 87, temperature 98.4 F (36.9 C), temperature source Oral, resp. rate 16, height 5\' 4"  (1.626 m), weight 68.5 kg, last menstrual period 06/16/2021, SpO2 100 %. Body mass index is 25.92 kg/m.   COGNITIVE FEATURES THAT CONTRIBUTE TO RISK:  None    SUICIDE RISK:   Mild:  Suicidal ideation of limited frequency, intensity, duration, and specificity.  There are no identifiable plans, no associated intent, mild dysphoria and related symptoms, good self-control (both objective and subjective assessment), few other risk factors, and identifiable protective factors, including available and accessible social support.  PLAN OF CARE: Continue 15-minute checks.  Discussed medication management with patient and suggest starting with an antidepressant such as fluoxetine.  Involve in individual and group therapy.  Reassess dangerousness prior to discharge  I certify that inpatient services furnished can reasonably be expected to improve the patient's condition.   06/18/2021, MD 07/12/2021, 5:36 PM

## 2021-07-12 NOTE — Group Note (Signed)
[facility] LCSW Group Therapy Note   Group Date: 05/22/2022 Start Time: 1300 End Time: 1400   Type of Therapy/Topic:  Group Therapy:  Emotion Regulation  Participation Level:  Did Not Attend   Mood:  Description of Group:    The purpose of this group is to assist patients in learning to regulate negative emotions and experience positive emotions. Patients will be guided to discuss ways in which they have been vulnerable to their negative emotions. These vulnerabilities will be juxtaposed with experiences of positive emotions or situations, and patients challenged to use positive emotions to combat negative ones. Special emphasis will be placed on coping with negative emotions in conflict situations, and patients will process healthy conflict resolution skills.  Therapeutic Goals: Patient will identify two positive emotions or experiences to reflect on in order to balance out negative emotions:  Patient will label two or more emotions that they find the most difficult to experience:  Patient will be able to demonstrate positive conflict resolution skills through discussion or role plays:   Summary of Patient Progress:   Patient did not attend group despite encouraged participation.     Therapeutic Modalities:   Cognitive Behavioral Therapy Feelings Identification Dialectical Behavioral Therapy   Khalilah Hoke W [place]aan Holzer, LCSWA 
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
## 2021-07-12 NOTE — ED Notes (Signed)
Breakfast placed at side. 

## 2021-07-12 NOTE — Tx Team (Signed)
Initial Treatment Plan 07/12/2021 1:01 PM Laura Mora [id number]    PATIENT STRESSORS: Financial difficulties   Marital or family conflict   Medication change or noncompliance   Traumatic event     PATIENT STRENGTHS: Average or above average intelligence  Communication skills  Physical Health  Religious Affiliation  Work skills    PATIENT IDENTIFIED PROBLEMS: PTSD  Suicidal attempt                   DISCHARGE CRITERIA:  Ability to meet basic life and health needs Improved stabilization in mood, thinking, and/or behavior Medical problems require only outpatient monitoring Verbal commitment to aftercare and medication compliance  PRELIMINARY DISCHARGE PLAN: Attend aftercare/continuing care group Outpatient therapy Return to previous work or school arrangements  PATIENT/FAMILY INVOLVEMENT: This treatment plan has been presented to and reviewed with the patient, Laura Mora, and/or family member,  The patient and family have been given the opportunity to ask questions and make suggestions.  Leonarda Salon, RN 07/12/2021, 1:01 PM

## 2021-07-12 NOTE — ED Notes (Signed)
Room 22

## 2021-07-12 NOTE — ED Notes (Signed)
Patient gives permission to speak with father, Onesty, Clair Father [phone]

## 2021-07-12 NOTE — ED Notes (Signed)
Report to Skylee, RN 

## 2021-07-12 NOTE — Progress Notes (Signed)
Pt calm and compliant during assessment denying SI/HI/AVH. Pt endorses depression. Pt observed interacting appropriately with staff and peers on the unit. Pt didn't have any medications scheduled tonight and hasn't requested anything PRN. Pt being monitored Q 15 minutes for safety per unit protocol. Pt remains safe on the unit.

## 2021-07-13 MED ORDER — FLUOXETINE HCL 20 MG PO CAPS: 20.0000 mg | ORAL_CAPSULE | Freq: Every day | ORAL | 1 refills | Status: DC
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
## 2021-07-13 NOTE — Progress Notes (Signed)
Recreation Therapy Notes    Date: 07/13/2021  Time: 11:00am    Location: Craft room      Behavioral response: N/A   Intervention Topic: Stress Management   Discussion/Intervention: Patient refused to attend group.   Clinical Observations/Feedback:  Patient refused to attend group.    Aira Sallade LRT/CTRS        Peggy Loge 07/13/2021 1:05 PM

## 2021-07-13 NOTE — Progress Notes (Signed)
Patient alert and awake this morning. Calm and cooperative. Present for meals and medications. Denies SI/HI/AVH. Rates headache 6/10. Rates anxiety 3/10 and depression 1/10.   Labs and vital signs monitored. Patient supported emotionally and encouraged to verbalize concerns Expressed to RN it is hard for her to attend groups due to her social anxiety. Reports goal is to go home today.    No adverse reactions to medications. Cont Q15 minute check for safety.

## 2021-07-13 NOTE — Discharge Summary (Signed)
Physician Discharge Summary Note  Patient:  Laura Mora is an 26 y.o., female MRN:  [id number] DOB:  1996/02/27 Patient phone:  [phone] (home)  Patient address:   [address] [place] [address],  Total Time spent with patient: 30 minutes  Date of Admission:  07/12/2021 Date of Discharge: 07/13/2021  Reason for Admission: Patient was admitted after presenting to the emergency room complaining of suicidal ideation acutely in the context of chronic depression and recurrent suicidal ideation  Principal Problem: MDD (major depressive disorder) Discharge Diagnoses: Principal Problem:   MDD (major depressive disorder) Active Problems:   Anxiety and depression   PTSD (post-traumatic stress disorder)   Past Psychiatric History: Longstanding history of depression.  Has had previous suicidal ideation.  Previous history of anorexia resulting in inpatient treatment but no other inpatient psychiatric treatment  Past Medical History:  Past Medical History:  Diagnosis Date   Anorexia    Anxiety    Bipolar 1 disorder (HCC)    Constipation    Depression    Dizziness    Frequent headaches     Past Surgical History:  Procedure Laterality Date   TOOTH EXTRACTION     Family History:  Family History  Problem Relation Age of Onset   Arthritis Father    Stroke Father    Arthritis Maternal Grandmother    Cancer Maternal Grandmother    Lung cancer Maternal Grandfather    Alcohol abuse Paternal Grandfather    Lung cancer Maternal Uncle    Family Psychiatric  History: Alcohol abuse in some family members Social History:  Social History   Substance and Sexual Activity  Alcohol Use Yes   Alcohol/week: 1.0 - 2.0 standard drink   Types: 1 - 2 Cans of beer per week   Comment: occasional     Social History   Substance and Sexual Activity  Drug Use No    Social History   Socioeconomic History   Marital status: Single    Spouse name: Not on file   Number of  children: Not on file   Years of education: Not on file   Highest education level: Not on file  Occupational History   Not on file  Tobacco Use   Smoking status: Never   Smokeless tobacco: Never  Substance and Sexual Activity   Alcohol use: Yes    Alcohol/week: 1.0 - 2.0 standard drink    Types: 1 - 2 Cans of beer per week    Comment: occasional   Drug use: No   Sexual activity: Never  Other Topics Concern   Not on file  Social History Narrative   Not on file   Social Determinants of Health   Financial Resource Strain: Not on file  Food Insecurity: Not on file  Transportation Needs: Not on file  Physical Activity: Not on file  Stress: Not on file  Social Connections: Not on file    Hospital Course: Admitted to psychiatric ward.  15-minute checks maintained.  Patient showed no dangerous or aggressive or inappropriate behaviors.  She was engaged and showed appropriate insight and interest in treatment to improve her long-term depression and probable PTSD.  Patient was started on fluoxetine which she has so far tolerated without any complaint.  On the day of discharge she denies any suicidal thought at all.  She has requested discharge stating that it is very important that she get back to work.  She has made a plan for follow-up medical and therapy care for  her depression.  Physical Findings: AIMS:  , ,  ,  ,    CIWA:    COWS:     Musculoskeletal: Strength & Muscle Tone: within normal limits Gait & Station: normal Patient leans: N/A   Psychiatric Specialty Exam:  Presentation  General Appearance: Appropriate for Environment  Eye Contact:Good  Speech:Clear and Coherent  Speech Volume:Normal  Handedness:Right   Mood and Affect  Mood:Depressed  Affect:Blunt; Flat; Depressed   Thought Process  Thought Processes:Coherent  Descriptions of Associations:Intact  Orientation:Full (Time, Place and Person)  Thought Content:Logical  History of  Schizophrenia/Schizoaffective disorder:No  Duration of Psychotic Symptoms:No data recorded Hallucinations:Hallucinations: None  Ideas of Reference:None  Suicidal Thoughts:Suicidal Thoughts: No  Homicidal Thoughts:Homicidal Thoughts: No   Sensorium  Memory:Immediate Good; Recent Good; Remote Good  Judgment:Fair  Insight:Fair   Executive Functions  Concentration:Fair  Attention Span:Fair  Recall:Fair  Fund of Knowledge:Fair  Language:Good   Psychomotor Activity  Psychomotor Activity:Psychomotor Activity: Normal   Assets  Assets:Communication Skills; Desire for Improvement; Financial Resources/Insurance; Physical Health; Resilience; Social Support   Sleep  Sleep:Sleep: Poor Number of Hours of Sleep: 3    Physical Exam: Physical Exam Vitals and nursing note reviewed.  Constitutional:      Appearance: Normal appearance.  HENT:     Head: Normocephalic and atraumatic.     Mouth/Throat:     Pharynx: Oropharynx is clear.  Eyes:     Pupils: Pupils are equal, round, and reactive to light.  Cardiovascular:     Rate and Rhythm: Normal rate and regular rhythm.  Pulmonary:     Effort: Pulmonary effort is normal.     Breath sounds: Normal breath sounds.  Abdominal:     General: Abdomen is flat.     Palpations: Abdomen is soft.  Musculoskeletal:        General: Normal range of motion.  Skin:    General: Skin is warm and dry.  Neurological:     General: No focal deficit present.     Mental Status: She is alert. Mental status is at baseline.  Psychiatric:        Mood and Affect: Mood normal.        Thought Content: Thought content normal.   Review of Systems  Constitutional: Negative.   HENT: Negative.    Eyes: Negative.   Respiratory: Negative.    Cardiovascular: Negative.   Gastrointestinal: Negative.   Musculoskeletal: Negative.   Skin: Negative.   Neurological: Negative.   Psychiatric/Behavioral: Negative.    Blood pressure 106/69, pulse 60,  temperature 98.7 F (37.1 C), temperature source Oral, resp. rate 18, height 5\' 4"  (1.626 m), weight 68.5 kg, last menstrual period 06/16/2021, SpO2 100 %. Body mass index is 25.92 kg/m.   Social History   Tobacco Use  Smoking Status Never  Smokeless Tobacco Never   Tobacco Cessation:  N/A, patient does not currently use tobacco products   Blood Alcohol level:  Lab Results  Component Value Date   ETH <10 07/11/2021   ETH <5 03/23/2016    Metabolic Disorder Labs:  Lab Results  Component Value Date   HGBA1C 5.8 01/12/2014   No results found for: PROLACTIN Lab Results  Component Value Date   CHOL 162 10/05/2020   TRIG 42 10/05/2020   HDL 72 10/05/2020   CHOLHDL 2.3 10/05/2020   VLDL 10/07/2020 05/13/2017   LDLCALC 81 10/05/2020   LDLCALC 103 (H) 05/13/2017    See Psychiatric Specialty Exam and Suicide Risk Assessment completed by Attending  Physician prior to discharge.  Discharge destination:  Home  Is patient on multiple antipsychotic therapies at discharge:  No   Has Patient had three or more failed trials of antipsychotic monotherapy by history:  No  Recommended Plan for Multiple Antipsychotic Therapies: NA  Discharge Instructions     Diet - low sodium heart healthy   Complete by: As directed    Increase activity slowly   Complete by: As directed       Allergies as of 07/13/2021       Reactions   Ibuprofen Swelling, Other (See Comments)   Coated tablets only        Medication List     STOP taking these medications    ISOtretinoin 30 MG capsule Commonly known as: ACCUTANE   OVER THE COUNTER MEDICATION   QC TUMERIC COMPLEX PO   Vitamin D3 50 MCG (2000 UT) Tabs       TAKE these medications      Indication  FLUoxetine 20 MG capsule Commonly known as: PROZAC Take 1 capsule (20 mg total) by mouth daily. Start taking on: July 14, 2021  Indication: Depression   Vitamin B-12 500 MCG Subl Place under the tongue.  Indication: Inadequate  Vitamin B12        Follow-up Information     [facility] Call.   Why: Call to confirm appointment which is scheduled for 07/18/19 at 8:00AM. Thanks! Contact information: [address], [address] [place], [place] [address] phone: [phone]        [facility] Follow [facility].   Specialty: Urgent Care Why: To set up with first appointment you must go to walk-in hours Monday-Friday 8am-11am. Thanks! Contact information: [address] [place] [phone]                Follow-up recommendations: Activity as usual.  Continue current medication management follow-up with therapist  Comments: Prescription provided  Signed: Mordecai RasmussenJohn Saatvik Thielman, MD 07/13/2021, 1:02 PM

## 2021-07-13 NOTE — [facility] Suicide Risk Assessment (Signed)
[facility] INPATIENT:  Family/Significant Other Suicide Prevention Education  Suicide Prevention Education:  Patient Refusal for Family/Significant Other Suicide Prevention Education: The patient Laura Mora has refused to provide written consent for family/significant other to be provided Family/Significant Other Suicide Prevention Education during admission and/or prior to discharge.  Physician notified.  SPE completed with pt, as pt refused to consent to family contact. SPI pamphlet provided to pt and pt was encouraged to share information with support network, ask questions, and talk about any concerns relating to SPE. Pt denies access to guns/firearms and verbalized understanding of information provided. Mobile Crisis information also provided to pt.  Glenis Smoker 07/13/2021, 1:09 PM

## 2021-07-13 NOTE — Progress Notes (Signed)
°  [facility] Adult Case Management Discharge Plan :  Will you be returning to the same living situation after discharge:  No. At discharge, do you have transportation home?: Yes,  pt parents to provide transportation to their home. Do you have the ability to pay for your medications: Yes,  [employer].  Release of information consent forms completed and in the chart;  Patient's signature needed at discharge.  Patient to Follow up at:  Follow-up Information     [facility], [place].. Call.   Why: Call to confirm appointment which is scheduled for 07/18/19 at 8:00AM. Thanks! Contact information: [address], [place], [place] [address] phone: [phone]        [facility] Follow up.   Specialty: Urgent Care Why: To set up with first appointment you must go to walk-in hours Monday-Friday 8am-11am. Thanks! Contact information: [place] [phone]                Next level of care provider has access to [facility] and Suicide Prevention discussed: Yes,  SPE completed with pt.     Has patient been referred to the Quitline?: N/A patient is not a smoker  Patient has been referred for addiction treatment: N/A  Shirl Harris, LCSW 07/13/2021, 1:28 PM

## 2021-07-13 NOTE — Progress Notes (Signed)
Discharge  D: Pt alert and oriented. Pt denies experiencing any pain, SI/HI, or AVH at this time. Pt reports she will be able to keep themselves safe when they return home.   A: Pt received discharge and medication education/information. Pt belongings were returned and signed for at this time to include printed prescriptions and valuables.    R: Pt verbalized understanding of discharge and medication education/information.   Pt escorted by staff to the medical mall front lobby where patient was picked up by family.

## 2021-10-30 ENCOUNTER — Encounter: Payer: Self-pay | Admitting: Family

## 2022-01-19 ENCOUNTER — Encounter: Payer: Self-pay | Admitting: Gastroenterology

## 2022-01-20 ENCOUNTER — Encounter: Payer: Self-pay | Admitting: Gastroenterology

## 2022-02-09 ENCOUNTER — Telehealth: Payer: Self-pay

## 2022-02-09 NOTE — Telephone Encounter (Signed)
No answer. Please give pt the option to be placed on our waiting list or to contact referring office to have their referral sent to a different office. Thank you.

## 2022-02-12 NOTE — Telephone Encounter (Signed)
Second call attempt, no answer 

## 2022-02-13 NOTE — Telephone Encounter (Signed)
Closed referral due to no pt response.

## 2022-03-16 ENCOUNTER — Other Ambulatory Visit: Payer: Self-pay

## 2022-03-19 ENCOUNTER — Ambulatory Visit: Payer: 59 | Admitting: Gastroenterology

## 8387-02-17 DEATH — deceased
